# Patient Record
Sex: Female | Born: 1935 | Race: White | Hispanic: No | Smoking: Never smoker
Health system: Southern US, Community
[De-identification: ages and names within clinical notes are randomized; demographics above are authoritative.]

## PROBLEM LIST (undated history)

## (undated) DIAGNOSIS — E079 Disorder of thyroid, unspecified: Secondary | ICD-10-CM

## (undated) DIAGNOSIS — I10 Essential (primary) hypertension: Secondary | ICD-10-CM

## (undated) DIAGNOSIS — E785 Hyperlipidemia, unspecified: Secondary | ICD-10-CM

## (undated) HISTORY — DX: Hyperlipidemia, unspecified: E78.5

---

## 1987-04-16 HISTORY — PX: BALLOON ANGIOPLASTY, ARTERY: SHX564

## 1998-03-21 ENCOUNTER — Other Ambulatory Visit: Admission: RE | Admit: 1998-03-21 | Discharge: 1998-03-21 | Payer: Self-pay | Admitting: Obstetrics and Gynecology

## 1999-03-28 ENCOUNTER — Other Ambulatory Visit: Admission: RE | Admit: 1999-03-28 | Discharge: 1999-03-28 | Payer: Self-pay | Admitting: Obstetrics and Gynecology

## 2003-05-13 ENCOUNTER — Emergency Department (HOSPITAL_COMMUNITY): Admission: AD | Admit: 2003-05-13 | Discharge: 2003-05-14 | Payer: Self-pay | Admitting: Emergency Medicine

## 2008-04-15 HISTORY — PX: CATARACT EXTRACTION: SUR2

## 2012-06-20 ENCOUNTER — Encounter (HOSPITAL_COMMUNITY): Payer: Self-pay

## 2012-06-20 DIAGNOSIS — R0789 Other chest pain: Secondary | ICD-10-CM | POA: Insufficient documentation

## 2012-06-20 DIAGNOSIS — Z79899 Other long term (current) drug therapy: Secondary | ICD-10-CM | POA: Insufficient documentation

## 2012-06-20 DIAGNOSIS — G5610 Other lesions of median nerve, unspecified upper limb: Secondary | ICD-10-CM | POA: Insufficient documentation

## 2012-06-20 DIAGNOSIS — Z7982 Long term (current) use of aspirin: Secondary | ICD-10-CM | POA: Insufficient documentation

## 2012-06-20 DIAGNOSIS — E039 Hypothyroidism, unspecified: Secondary | ICD-10-CM | POA: Insufficient documentation

## 2012-06-20 DIAGNOSIS — I10 Essential (primary) hypertension: Secondary | ICD-10-CM | POA: Insufficient documentation

## 2012-06-20 DIAGNOSIS — Z9861 Coronary angioplasty status: Secondary | ICD-10-CM | POA: Insufficient documentation

## 2012-06-20 DIAGNOSIS — R9431 Abnormal electrocardiogram [ECG] [EKG]: Secondary | ICD-10-CM | POA: Insufficient documentation

## 2012-06-20 LAB — BASIC METABOLIC PANEL
BUN: 16 mg/dL (ref 6–23)
Chloride: 104 mEq/L (ref 96–112)
Creatinine, Ser: 0.84 mg/dL (ref 0.50–1.10)
GFR calc non Af Amer: 65 mL/min — ABNORMAL LOW (ref >90)
Glucose, Bld: 138 mg/dL — ABNORMAL HIGH (ref 70–99)
Potassium: 3.8 mEq/L (ref 3.5–5.1)

## 2012-06-20 LAB — CBC WITH DIFFERENTIAL/PLATELET
Basophils Relative: 1 % (ref 0–1)
Eosinophils Absolute: 0.2 10*3/uL (ref 0.0–0.7)
HCT: 40.7 % (ref 36.0–46.0)
Hemoglobin: 14.5 g/dL (ref 12.0–15.0)
MCH: 32.6 pg (ref 26.0–34.0)
MCHC: 35.6 g/dL (ref 30.0–36.0)
Monocytes Absolute: 0.7 10*3/uL (ref 0.1–1.0)
Monocytes Relative: 11 % (ref 3–12)

## 2012-06-20 NOTE — ED Notes (Signed)
Pt c/o numbness to hand and toes on left side for 3 days.  Pt states it has gotten a little worse over the days.  Pt able to move fingers and toes.  Pt ambulatory with steady gait in triage.  No weakness or slurred speech.

## 2012-06-20 NOTE — ED Notes (Addendum)
Per patient symptoms started approx 3 days ago, reports left sided numbness and tingling. Pt ambulatory, alert and oriented, and using left arm. Was sent to be evaluated by urgent care.

## 2012-06-21 ENCOUNTER — Other Ambulatory Visit: Payer: Self-pay

## 2012-06-21 ENCOUNTER — Emergency Department (HOSPITAL_COMMUNITY)
Admission: EM | Admit: 2012-06-21 | Discharge: 2012-06-21 | Disposition: A | Discharge status: Home / Self Care | Payer: Medicare Other | Attending: Emergency Medicine | Admitting: Emergency Medicine

## 2012-06-21 HISTORY — DX: Disorder of thyroid, unspecified: E07.9

## 2012-06-21 HISTORY — DX: Essential (primary) hypertension: I10

## 2012-06-21 NOTE — ED Notes (Signed)
D/c instructions reviewed w/ pt and family - pt and family deny any further questions or concerns at present. Pt ambulating independently w/ steady gait on d/c in no acute distress, A&Ox4.  

## 2012-06-21 NOTE — ED Provider Notes (Signed)
History     CSN: [id number]  Arrival date & time 06/20/12  1649   None     Chief Complaint  Patient presents with  . Numbness    (Consider location/radiation/quality/duration/timing/severity/associated sxs/prior treatment) HPI Brooke Larson is a 79 y.o. female with a history of hypertension and hypothyroidism also of coronary artery disease status post angioplasty in 1989, no stents, who presents with left hand tingling and left-sided toe tip tingling as well. In interviewing the patient, her son is present and says her mother has also been complaining about intermittent epigastric chest tightness. This is currently not present, it is mild, it comes and goes, there are no known alleviating or exacerbating factors, there are no other associated symptoms such as shortness of breath, nausea or vomiting, diaphoresis.  Patient is concern for stroke. She has no history of CVA or TIA. She has no changes in vision, no double vision or blurred vision, no other weakness, no other tingling. Patient is a right-handed female.  She says her left hand will intermittently tingles and she points the distribution of the palmar aspect of her thumb first and second fingers and part of her fourth digit., She does not include her fifth digit and does not occlude the dorsal aspect of her hand.  She has no history of carpal tunnel syndrome, she has no wrist pain that wakes her up at night. She has no recent fevers, no recent chills. No recent trauma.     Past Medical History  Diagnosis Date  . Hypertension   . Thyroid disease     Past Surgical History  Procedure Laterality Date  . Balloon angioplasty, artery      No family history on file.  History  Substance Use Topics  . Smoking status: Never Smoker   . Smokeless tobacco: Not on file  . Alcohol Use: No    OB History   Grav Para Term Preterm Abortions TAB SAB Ect Mult Living                  Review of Systems At least 10pt or greater review of  systems completed and are negative except where specified in the HPI.  Allergies  Review of patient's allergies indicates no known allergies.  Home Medications   Current Outpatient Rx  Name  Route  Sig  Dispense  Refill  . aspirin EC 81 MG tablet   Oral   Take 81 mg by mouth daily.         . carvedilol (COREG) 12.5 MG tablet   Oral   Take 12.5 mg by mouth 2 (two) times daily with a meal.         . levothyroxine (SYNTHROID, LEVOTHROID) 75 MCG tablet   Oral   Take 75 mcg by mouth daily.           BP 201/76  Pulse 66  Temp(Src) 98.3 F (36.8 C) (Oral)  Resp 18  SpO2 96%  Physical Exam  Nursing notes reviewed.  Electronic medical record reviewed. VITAL SIGNS:   Filed Vitals:   06/20/12 2019 06/21/12 0008  BP: 160/75 201/76  Pulse: 70 66  Temp: 98.2 F (36.8 C) 98.3 F (36.8 C)  TempSrc: Oral Oral  Resp: 16 18  SpO2: 96% 96%   CONSTITUTIONAL: Awake, oriented, appears non-toxic HENT: Atraumatic, normocephalic, oral mucosa pink and moist, airway patent. Nares patent without drainage. External ears normal. EYES: Conjunctiva clear, EOMI, PERRLA NECK: Trachea midline, non-tender, supple CARDIOVASCULAR: Normal heart rate,  Normal rhythm, No murmurs, rubs, gallops PULMONARY/CHEST: Clear to auscultation, no rhonchi, wheezes, or rales. Symmetrical breath sounds. Non-tender. ABDOMINAL: Non-distended, soft, non-tender - no rebound or guarding.  BS normal. NEUROLOGIC: Radial nerves are normal. Non-focal, moving all four extremities, no gross sensory or motor deficits. Strength in the upper and lower extremities flexors and extensors is 5/5. EXTREMITIES: No clubbing, cyanosis, or edema. Negative Tinel's and Phalen's. Patient has tingling in the distribution of the median nerve on the volar aspect of the left hand. Patient's hand strength and grip is 5/5. Tips of the toes have been numb, there is no weakness in the toes, left foot is neurovascularly intact. SKIN: Warm, Dry, No  erythema, No rash  ED Course  Procedures (including critical care time)  Date: 06/21/2012  Rate: 63  Rhythm: normal sinus rhythm  QRS Axis: normal  Intervals: normal  ST/T Wave abnormalities: normal  Conduction Disutrbances: none  Narrative Interpretation: unremarkable    Labs Reviewed  BASIC METABOLIC PANEL - Abnormal; Notable for the following:    Glucose, Bld 138 (*)    GFR calc non Af Amer 65 (*)    GFR calc Af Amer 75 (*)    All other components within normal limits  CBC WITH DIFFERENTIAL   No results found. No results found.    1. Median nerve neuropathy, left   2. Paresthesia of foot   3. Chest tightness       MDM  Brooke Larson is a 79 y.o. female presents to the ED from urgent care referred for left hand numbness and tingling as well as left toe tip numbness and tingling. The left toe tips I think are most likely related to wearing shoes, this has not had any neurologic pattern that I can think of, this started about 3 days ago, she has no weakness, no difficulties walking.  The left hand is in the median nerve distribution, but this is likely related to an early carpal tunnel syndrome, she has no Tinel's or Phalen's but these are notoriously nonspecific and insensitive test. We'll put her in a wrist splint.  Patient does have a primary care physician. She can followup with primary care physician for these diffuse problems. The son was suggesting the patient has been playing off some epigastric tightness versus chest tightness that is intermittent. Patient has a history of hypertension, she also has history of angioplasty in 1989. If this is been recent, what expect to see some abnormalities in her troponin levels.  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   Question anginal equivalent - but she's currently having no symptoms in the emergency department. Troponin is unremarkable. Because of these things I think she is a low  risk of myocardial infarction, she can followup with cardiologist.  Splint hand for early median neuropathy - f/u with PCP for hand numbness.  I explained the diagnosis and have given explicit precautions to return to the ER including any other new or worsening symptoms. The patient understands and accepts the medical plan as it's been dictated and I have answered their questions. Discharge instructions concerning home care and prescriptions have been given.  The patient is STABLE and is discharged to home in good condition.          Jones Skene, MD 06/25/12 [phone]

## 2014-09-22 ENCOUNTER — Encounter: Payer: Self-pay | Admitting: Neurology

## 2014-09-22 ENCOUNTER — Ambulatory Visit: Payer: Medicare Other | Admitting: Neurology

## 2014-09-22 ENCOUNTER — Ambulatory Visit (INDEPENDENT_AMBULATORY_CARE_PROVIDER_SITE_OTHER): Payer: Medicare Other | Admitting: Neurology

## 2014-09-22 VITALS — BP 154/89 | HR 65 | Temp 97.1°F | Ht 62.0 in | Wt 142.2 lb

## 2014-09-22 DIAGNOSIS — H6120 Impacted cerumen, unspecified ear: Secondary | ICD-10-CM

## 2014-09-22 DIAGNOSIS — H9193 Unspecified hearing loss, bilateral: Secondary | ICD-10-CM

## 2014-09-22 DIAGNOSIS — H6123 Impacted cerumen, bilateral: Secondary | ICD-10-CM

## 2014-09-22 DIAGNOSIS — I639 Cerebral infarction, unspecified: Secondary | ICD-10-CM

## 2014-09-22 DIAGNOSIS — R270 Ataxia, unspecified: Secondary | ICD-10-CM | POA: Diagnosis not present

## 2014-09-22 DIAGNOSIS — R42 Dizziness and giddiness: Secondary | ICD-10-CM

## 2014-09-22 DIAGNOSIS — G450 Vertebro-basilar artery syndrome: Secondary | ICD-10-CM | POA: Insufficient documentation

## 2014-09-22 DIAGNOSIS — I6350 Cerebral infarction due to unspecified occlusion or stenosis of unspecified cerebral artery: Secondary | ICD-10-CM

## 2014-09-22 DIAGNOSIS — H9190 Unspecified hearing loss, unspecified ear: Secondary | ICD-10-CM | POA: Insufficient documentation

## 2014-09-22 MED ORDER — ALPRAZOLAM 0.5 MG PO TABS: 0.5000 mg | ORAL_TABLET | Freq: Two times a day (BID) | ORAL | Status: DC | PRN

## 2014-09-22 NOTE — Progress Notes (Signed)
[facility]    Provider:  Dr Jaynee Eagles Referring Provider: Prince Solian, MD Primary Care Physician:  Gilford Rile, MD  CC:  dizziness  HPI:  Brooke Larson is a 81 y.o. female here as a referral from Dr. Dagmar Hait for dizziness. Past medical history of anxiety, coronary artery disease, hyperlipidemia, hypertension, hypothyroidism, malaise and fatigue, myalgia. She is here with her daughter who also provides information. Started in March after she woke up, she "felt like she didn't have legs" and fell down.She hit her head, not so hard. Her BP was over 474 systolic. She didn't go to the doctor, every morning she was dizzy walking. Since then it has progressed, more frequent throughout the day. On the 14th of March her BP went high and she was very dizzy. Workup was negative. No room spinning, not lightheaded, she is more off balance. Not so much in the morning. It happens when she stands up. The sun and light bothers her. She was placed on more BP medicine and BP is better but the dizziness is not. Happens every day and lasts for a brief time.  She can't drive, gets dizzy when she looks up through the windshield, no blurry vision, no hearing changes. When sitting, feels ok. She has been to the eye doctor. No preceding illness or inciting factors. She has had episodes of vertigo with room spinning as well. Meclizine didn't help. No other focal associated neurologic conditions. She has had a stroke. Here with daughter. She had a stroke 2 years ago.  No nausea or vomiting. She has some hearing loss especialy if there is a lot talking in the room. Denies headache.  Reviewed notes, labs and imaging from outside physicians, which showed: Per [facility] notes, patient describes vertigo as well as a mild level of orthostasis. CT of the head was unremarkable. Dizziness has occurred in the setting of hypertension as well as without elevated blood pressure. On one occasion for high  blood pressure occurred with dizziness and she was taken to the emergency department, she had a head CT and blood work and was discharged. CBC with differential unremarkable. TSH within normal limits. CMP unremarkable. LDL 154(cholesterol medication was recommended by PCP)  Review of Systems: Patient complains of symptoms per HPI as well as the following symptoms: Snoring, weakness, dizziness. Pertinent negatives per HPI. All others negative.   History   Social History  . Marital Status: Widowed    Spouse Name: N/A  . Number of Children: 3  . Years of Education: 12   Occupational History  . Retired     Social History Main Topics  . Smoking status: Never Smoker   . Smokeless tobacco: Not on file  . Alcohol Use: No  . Drug Use: No  . Sexual Activity: Not on file   Other Topics Concern  . Not on file   Social History Narrative   Lives at home with herself.   Caffeine use: 1/2 cup coffee per day, 2 glasses tea per day    Family History  Problem Relation Age of Onset  . Breast cancer Mother   . Stroke Father   . Alzheimer's disease Mother     Past Medical History  Diagnosis Date  . Hypertension   . Thyroid disease   . Hyperlipemia     Past Surgical History  Procedure Laterality Date  . Balloon angioplasty, artery  1989  . Cataract extraction Bilateral 2010    Current Outpatient Prescriptions  Medication Sig Dispense  Refill  . carvedilol (COREG) 12.5 MG tablet Take 12.5 mg by mouth 2 (two) times daily with a meal.    . clopidogrel (PLAVIX) 75 MG tablet Take 75 mg by mouth.    . levothyroxine (SYNTHROID, LEVOTHROID) 75 MCG tablet Take 75 mcg by mouth daily.    Marland Kitchen losartan (COZAAR) 50 MG tablet Take 50 mg by mouth daily.    Marland Kitchen ALPRAZolam (XANAX) 0.5 MG tablet Take 1 tablet (0.5 mg total) by mouth 2 (two) times daily as needed for anxiety. 60 tablet 3  . aspirin EC 81 MG tablet Take 81 mg by mouth daily.     No current facility-administered medications for this  visit.    Allergies as of 09/22/2014  . (No Known Allergies)    Vitals: BP 154/89 mmHg  Pulse 65  Temp(Src) 97.1 F (36.2 C)  Ht 5\' 2"  (1.575 m)  Wt 142 lb 3.2 oz (64.501 kg)  BMI 26.00 kg/m2 Last Weight:  Wt Readings from Last 1 Encounters:  09/22/14 142 lb 3.2 oz (64.501 kg)   Last Height:   Ht Readings from Last 1 Encounters:  09/22/14 5\' 2"  (1.575 m)   Physical exam: Exam: Gen: NAD, conversant, well nourised, well groomed                     CV: RRR, no MRG. No Carotid Bruits. No peripheral edema, warm, nontender Eyes: Conjunctivae clear without exudates or hemorrhage  Neuro: Detailed Neurologic Exam  Speech:    Speech is normal; fluent and spontaneous with normal comprehension.  Cognition:    The patient is oriented to person, place, and time;     recent and remote memory intact;     language fluent;     normal attention, concentration,     fund of knowledge Cranial Nerves:    The pupils are equal, round, and reactive to light. The fundi are flat. Visual fields are full to finger confrontation. Extraocular movements are intact. Trigeminal sensation is intact and the muscles of mastication are normal. The face is symmetric. The palate elevates in the midline. Hearing intact. Voice is normal. Shoulder shrug is normal. The tongue has normal motion without fasciculations.   Coordination:    No dysmetria  Gait:    No ataxia  Motor Observation:    No asymmetry, no atrophy, and no involuntary movements noted. Tone:    Normal muscle tone.    Posture:    Posture is normal. normal erect    Strength:    Strength is V/V in the upper and lower limbs.      Sensation: intact to LT     Reflex Exam:  DTR's:    Deep tendon reflexes in the upper and lower extremities are symmetrical bilaterally.   Toes:    The toes are downgoing bilaterally.   Clonus:    Clonus is absent.       Assessment/Plan:   Brooke Larson is a 81 y.o. female here as a referral from  Dr. Dagmar Hait for dizziness. Past medical history of anxiety, coronary artery disease, hyperlipidemia, hypertension, hypothyroidism, malaise and fatigue, myalgia. Dizziness started in March after she fell and hit her head in the setting of elevated blood pressure.  Her BP was over 034 systolic. Since then she is experiencing dizziness. Also has a history of vertigo and has vertiginous episodes with hearing loss. Suspect there is the component of anxiety as well.  Patient has risk factors for stroke and history of  stroke, need to rule out central cause of vertigo with MRI of the brain such as pathologies affecting the brainstem or vestibular nerve, vascular event involving the cerebellum, schwannoma, MRA to detect stenosis or occlusion of the posterior circulation.   - MRI of the brain and MRA of the head.   -Will refer to ENT for hearing loss, also possibly they can clear up the wax in her ears so the tympanic membranes can be evaluated as there may be fluid behind the ears causing dizziness.  - Needs physical therapy for vestibular rehabilitation.   Sarina Ill, MD  [facility] [address] [place],  [address]  Phone [phone] Fax [phone]

## 2014-09-22 NOTE — Patient Instructions (Signed)
Overall you are doing fairly well but I do want to suggest a few things today:   Remember to drink plenty of fluid, eat healthy meals and do not skip any meals. Try to eat protein with a every meal and eat a healthy snack such as fruit or nuts in between meals. Try to keep a regular sleep-wake schedule and try to exercise daily, particularly in the form of walking, 20-30 minutes a day, if you can.   As far as your medications are concerned, I would like to suggest: Alprazolam 0.5mg  twice daily  As far as diagnostic testing: MRi of the brain, ENT, vestibular rehab  I would like to see you back in 3 months, sooner if we need to. Please call us with any interim questions, concerns, problems, updates or refill requests.   Please also call us for any test results so we can go over those with you on the phone.  My clinical assistant and will answer any of your questions and relay your messages to me and also relay most of my messages to you.   Our phone number is [phone]. We also have an after hours call service for urgent matters and there is a physician on-call for urgent questions. For any emergencies you know to call 911 or go to the nearest emergency room

## 2014-09-24 ENCOUNTER — Encounter: Payer: Self-pay | Admitting: Neurology

## 2014-10-10 ENCOUNTER — Ambulatory Visit
Admission: RE | Admit: 2014-10-10 | Discharge: 2014-10-10 | Disposition: A | Discharge status: Home / Self Care | Payer: Medicare Other | Source: Ambulatory Visit | Attending: Neurology | Admitting: Neurology

## 2014-10-10 DIAGNOSIS — R270 Ataxia, unspecified: Secondary | ICD-10-CM | POA: Diagnosis not present

## 2014-10-10 DIAGNOSIS — I639 Cerebral infarction, unspecified: Secondary | ICD-10-CM

## 2014-10-10 DIAGNOSIS — G450 Vertebro-basilar artery syndrome: Secondary | ICD-10-CM

## 2014-10-10 DIAGNOSIS — H9193 Unspecified hearing loss, bilateral: Secondary | ICD-10-CM

## 2014-10-10 DIAGNOSIS — R42 Dizziness and giddiness: Secondary | ICD-10-CM

## 2014-10-10 DIAGNOSIS — H6120 Impacted cerumen, unspecified ear: Secondary | ICD-10-CM

## 2014-10-20 ENCOUNTER — Telehealth: Payer: Self-pay | Admitting: *Deleted

## 2014-10-20 NOTE — Telephone Encounter (Signed)
Left message for pt to call back regarding results Dr. Jaynee Eagles previously left a message about. Gave [facility] phone number and office hours.

## 2014-10-31 NOTE — Telephone Encounter (Signed)
-----   Message from Melvenia Beam, MD sent at 10/14/2014 11:43 AM EDT ----- Called and left a message for patient. She has Extensive T2/FLAIR hyperintense foci in the white matter in the pons and bilateral hemispheres most consistent with chronic microvascular ischemic changes, more than expected for age. This finding appears to be stable and there is no definite change when compared to an MRI dated 11/19/2012. The good news is that there are no new changes but she has a lot of white matter small-vessel disease from poorly controlled vascular risk factors especially HTN. The best thing to do is follow with her primary care to tightly control her blood pressure, cholesterol, glucose and other vascular risk factors. Terrence Dupont, would you call and ensure patient received my message, go over results again, and see if she has any questions. If they like, I am happy to discuss personally with them or have them come to a follow up with me. thanks

## 2014-10-31 NOTE — Telephone Encounter (Signed)
Spoke to patient - reviewed results again - she will make an appt w/ her PCP.

## 2017-05-13 ENCOUNTER — Emergency Department (HOSPITAL_COMMUNITY)
Admission: EM | Admit: 2017-05-13 | Discharge: 2017-05-13 | Disposition: A | Discharge status: Home / Self Care | Payer: Medicare Other | Attending: Emergency Medicine | Admitting: Emergency Medicine

## 2017-05-13 ENCOUNTER — Encounter (HOSPITAL_COMMUNITY): Payer: Self-pay | Admitting: Emergency Medicine

## 2017-05-13 ENCOUNTER — Emergency Department (HOSPITAL_COMMUNITY): Payer: Medicare Other

## 2017-05-13 DIAGNOSIS — T50905A Adverse effect of unspecified drugs, medicaments and biological substances, initial encounter: Secondary | ICD-10-CM | POA: Insufficient documentation

## 2017-05-13 DIAGNOSIS — Z8673 Personal history of transient ischemic attack (TIA), and cerebral infarction without residual deficits: Secondary | ICD-10-CM | POA: Diagnosis not present

## 2017-05-13 DIAGNOSIS — T887XXA Unspecified adverse effect of drug or medicament, initial encounter: Secondary | ICD-10-CM | POA: Insufficient documentation

## 2017-05-13 DIAGNOSIS — Y69 Unspecified misadventure during surgical and medical care: Secondary | ICD-10-CM | POA: Diagnosis not present

## 2017-05-13 DIAGNOSIS — I10 Essential (primary) hypertension: Secondary | ICD-10-CM | POA: Diagnosis not present

## 2017-05-13 DIAGNOSIS — E860 Dehydration: Secondary | ICD-10-CM | POA: Diagnosis not present

## 2017-05-13 DIAGNOSIS — Z7982 Long term (current) use of aspirin: Secondary | ICD-10-CM | POA: Diagnosis not present

## 2017-05-13 DIAGNOSIS — Z9181 History of falling: Secondary | ICD-10-CM | POA: Insufficient documentation

## 2017-05-13 DIAGNOSIS — Z7901 Long term (current) use of anticoagulants: Secondary | ICD-10-CM | POA: Diagnosis not present

## 2017-05-13 DIAGNOSIS — Z79899 Other long term (current) drug therapy: Secondary | ICD-10-CM | POA: Diagnosis not present

## 2017-05-13 DIAGNOSIS — R5383 Other fatigue: Secondary | ICD-10-CM | POA: Insufficient documentation

## 2017-05-13 DIAGNOSIS — R531 Weakness: Secondary | ICD-10-CM | POA: Insufficient documentation

## 2017-05-13 LAB — I-STAT VENOUS BLOOD GAS, ED
ACID-BASE EXCESS: 4 mmol/L — AB (ref 0.0–2.0)
Bicarbonate: 29.6 mmol/L — ABNORMAL HIGH (ref 20.0–28.0)
O2 SAT: 47 %
PCO2 VEN: 49.5 mmHg (ref 44.0–60.0)
TCO2: 31 mmol/L (ref 22–32)
pH, Ven: 7.385 (ref 7.250–7.430)
pO2, Ven: 26 mmHg — CL (ref 32.0–45.0)

## 2017-05-13 LAB — CBC
HCT: 39.5 % (ref 36.0–46.0)
Hemoglobin: 13.3 g/dL (ref 12.0–15.0)
MCH: 32.1 pg (ref 26.0–34.0)
MCHC: 33.7 g/dL (ref 30.0–36.0)
MCV: 95.4 fL (ref 78.0–100.0)
PLATELETS: 246 10*3/uL (ref 150–400)
RBC: 4.14 MIL/uL (ref 3.87–5.11)
RDW: 13.3 % (ref 11.5–15.5)
WBC: 4.8 10*3/uL (ref 4.0–10.5)

## 2017-05-13 LAB — COMPREHENSIVE METABOLIC PANEL
ALT: 16 U/L (ref 14–54)
AST: 28 U/L (ref 15–41)
Albumin: 3.7 g/dL (ref 3.5–5.0)
Alkaline Phosphatase: 73 U/L (ref 38–126)
Anion gap: 10 (ref 5–15)
BILIRUBIN TOTAL: 1.4 mg/dL — AB (ref 0.3–1.2)
BUN: 9 mg/dL (ref 6–20)
CHLORIDE: 104 mmol/L (ref 101–111)
CO2: 25 mmol/L (ref 22–32)
CREATININE: 1.15 mg/dL — AB (ref 0.44–1.00)
Calcium: 9.1 mg/dL (ref 8.9–10.3)
GFR calc non Af Amer: 43 mL/min — ABNORMAL LOW (ref >60)
GFR, EST AFRICAN AMERICAN: 50 mL/min — AB (ref >60)
Glucose, Bld: 139 mg/dL — ABNORMAL HIGH (ref 65–99)
POTASSIUM: 3.6 mmol/L (ref 3.5–5.1)
Sodium: 139 mmol/L (ref 135–145)
TOTAL PROTEIN: 6.2 g/dL — AB (ref 6.5–8.1)

## 2017-05-13 LAB — URINALYSIS, ROUTINE W REFLEX MICROSCOPIC
Bilirubin Urine: NEGATIVE
Glucose, UA: NEGATIVE mg/dL
HGB URINE DIPSTICK: NEGATIVE
Ketones, ur: NEGATIVE mg/dL
NITRITE: NEGATIVE
PROTEIN: NEGATIVE mg/dL
SPECIFIC GRAVITY, URINE: 1.005 (ref 1.005–1.030)
pH: 6 (ref 5.0–8.0)

## 2017-05-13 LAB — I-STAT TROPONIN, ED: TROPONIN I, POC: 0 ng/mL (ref 0.00–0.08)

## 2017-05-13 LAB — CBG MONITORING, ED: Glucose-Capillary: 135 mg/dL — ABNORMAL HIGH (ref 65–99)

## 2017-05-13 LAB — TSH: TSH: 1.737 u[IU]/mL (ref 0.350–4.500)

## 2017-05-13 MED ORDER — IPRATROPIUM-ALBUTEROL 0.5-2.5 (3) MG/3ML IN SOLN
3.0000 mL | Freq: Once | RESPIRATORY_TRACT | Status: AC
  Administered 2017-05-13: 3 mL via RESPIRATORY_TRACT
  Filled 2017-05-13: qty 3

## 2017-05-13 MED ORDER — SODIUM CHLORIDE 0.9 % IV BOLUS (SEPSIS)
1000.0000 mL | Freq: Once | INTRAVENOUS | Status: AC
  Administered 2017-05-13: 1000 mL via INTRAVENOUS

## 2017-05-13 NOTE — ED Notes (Signed)
Patient Alert and oriented X4. Stable and ambulatory. Patient verbalized understanding of the discharge instructions.  Patient belongings were taken by the patient.  

## 2017-05-13 NOTE — ED Notes (Signed)
Pt alert and oriented x 4 with no focal neuro deficits. Ambulatory in triage.

## 2017-05-13 NOTE — ED Provider Notes (Signed)
[facility] EMERGENCY DEPARTMENT Provider Note   CSN: [id number] Arrival date & time: 05/13/17  1501     History   Chief Complaint Chief Complaint  Patient presents with  . Fall  . Altered Mental Status    HPI Brooke Larson is a 84 y.o. female.  HPI   84 year old female with history of hypertension, hyperlipidemia, here with generalized weakness.  The patient reportedly has had multiple infections over the last month.  She was treated for a pneumonia at the end of December into January and finished 10 days of antibiotics and prednisone.  She felt slightly improved the next week.  Approximately a week and a half ago, patient reportedly fell and hit her head.  She is on Plavix.  She had no loss of consciousness.  Since then, she has had moderate tailbone pain but denies any headache.  No focal numbness or weakness.  She states that she has just had very poor energy.  She is also currently on antibiotic for UTI, ciprofloxacin.  She denies any urinary symptoms.  Denies any change in her appetite.  States she just feels "very tired" all the time.  Denies any recent medication changes.  She is not on any new medicines.  Denies any pain.  No lower extremity or other focal numbness or weakness.  Past Medical History:  Diagnosis Date  . Hyperlipemia   . Hypertension   . Thyroid disease     Patient Active Problem List   Diagnosis Date Noted  . Dizziness and giddiness 09/22/2014  . 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 Ischemic stroke ([place]) 09/22/2014  . 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 Wax in ear 09/22/2014  . Hearing loss 09/22/2014    Past Surgical History:  Procedure Laterality Date  . BALLOON ANGIOPLASTY, ARTERY  1989  . CATARACT EXTRACTION Bilateral 2010    OB History    No data available       Home Medications    Prior to Admission medications   Medication Sig Start Date End Date Taking? Authorizing Provider  ALPRAZolam Duanne Moron) 0.5 MG tablet Take 1  tablet (0.5 mg total) by mouth 2 (two) times daily as needed for anxiety. 09/22/14   Melvenia Beam, MD  aspirin EC 81 MG tablet Take 81 mg by mouth daily.    [provider]  carvedilol (COREG) 12.5 MG tablet Take 12.5 mg by mouth 2 (two) times daily with a meal.    [provider]  clopidogrel (PLAVIX) 75 MG tablet Take 75 mg by mouth. 12/29/13   [provider]  levothyroxine (SYNTHROID, LEVOTHROID) 75 MCG tablet Take 75 mcg by mouth daily.    [provider]  losartan (COZAAR) 50 MG tablet Take 50 mg by mouth daily. 08/17/14   [provider]    Family History Family History  Problem Relation Age of Onset  . Breast cancer Mother   . Alzheimer's disease Mother   . Stroke Father     Social History Social History   Tobacco Use  . Smoking status: Never Smoker  Substance Use Topics  . Alcohol use: No  . Drug use: No     Allergies   Patient has no known allergies.   Review of Systems Review of Systems  Constitutional: Positive for fatigue.  Neurological: Positive for weakness.  Psychiatric/Behavioral: Positive for confusion.  All other systems reviewed and are negative.    Physical Exam Updated Vital Signs BP (!) 177/71   Pulse 68  Temp 98.4 F (36.9 C) (Oral)   Resp 19   Ht 5\' 2"  (1.575 m)   Wt 58.5 kg (129 lb)   SpO2 96%   BMI 23.59 kg/m   Physical Exam   ED Treatments / Results  Labs (all labs ordered are listed, but only abnormal results are displayed) Labs Reviewed  COMPREHENSIVE METABOLIC PANEL - Abnormal; Notable for the following components:      Result Value   Glucose, Bld 139 (*)    Creatinine, Ser 1.15 (*)    Total Protein 6.2 (*)    Total Bilirubin 1.4 (*)    GFR calc non Af Amer 43 (*)    GFR calc Af Amer 50 (*)    All other components within normal limits  URINALYSIS, ROUTINE W REFLEX MICROSCOPIC - Abnormal; Notable for the following components:   Color, Urine STRAW (*)    Leukocytes, UA  MODERATE (*)    Bacteria, UA RARE (*)    Squamous Epithelial / LPF 0-5 (*)    All other components within normal limits  CBG MONITORING, ED - Abnormal; Notable for the following components:   Glucose-Capillary 135 (*)    All other components within normal limits  I-STAT VENOUS BLOOD GAS, ED - Abnormal; Notable for the following components:   pO2, Ven 26.0 (*)    Bicarbonate 29.6 (*)    Acid-Base Excess 4.0 (*)    All other components within normal limits  URINE CULTURE  CBC  TSH  I-STAT TROPONIN, ED    EKG  EKG Interpretation  Date/Time:  Tuesday May 13 2017 17:40:17 EST Ventricular Rate:  70 PR Interval:    QRS Duration: 95 QT Interval:  394 QTC Calculation: 426 R Axis:   -13 Text Interpretation:  Sinus rhythm Consider anterior infarct Baseline wander in lead(s) V2 V6 No significant change since last tracing Confirmed by Duffy Bruce [phone]) on 05/13/2017 6:10:30 PM       Radiology Dg Chest 2 View  Result Date: 05/13/2017 CLINICAL DATA:  Pt had a fall 10 days ago and family reports increased confusion since hitting her head. Pt denies pain in hips. Pt denies chest complaints. Recently treated for PNA. EXAM: CHEST  2 VIEW COMPARISON:  Chest x-ray dated 06/27/2014. chest CT dated 04/25/2017. FINDINGS: Heart size and mediastinal contours are stable. Lungs are stable. No pleural effusion or pneumothorax seen. No acute or suspicious osseous finding. Stable kyphosis of the thoracic spine. IMPRESSION: No active cardiopulmonary disease. Electronically Signed   By: Franki Cabot M.D.   On: 05/13/2017 18:53   Ct Head Wo Contrast  Result Date: 05/13/2017 CLINICAL DATA:  84 year old who fell 10 days ago, striking the back of her head. Patient is currently anticoagulated with Plavix. EXAM: CT HEAD WITHOUT CONTRAST TECHNIQUE: Contiguous axial images were obtained from the base of the skull through the vertex without intravenous contrast. COMPARISON:  MRI brain 10/10/2014, 11/19/2012.   CT head 06/27/2014. FINDINGS: Brain: Head tilt in the gantry accounts for the apparent asymmetry in the cerebral hemispheres. Moderate age related cortical atrophy and mild deep atrophy. Asymmetric enlargement of the occipital horn of the left lateral ventricle, unchanged. Severe changes of small vessel disease of the white matter diffusely, unchanged. No mass lesion. No midline shift. No acute hemorrhage or hematoma. No extra-axial fluid collections. No evidence of acute infarction. Vascular: Severe bilateral carotid siphon and vertebral artery atherosclerosis. No hyperdense vessel. Skull: No skull fracture or other focal osseous abnormality involving the skull. Sinuses/Orbits: Visualized paranasal  sinuses, bilateral mastoid air cells and bilateral middle ear cavities well-aerated. Visualized orbits and globes normal in appearance. Other: None. IMPRESSION: 1. No acute intracranial abnormality. 2. Stable moderate age related cortical atrophy and stable severe chronic microvascular ischemic changes of the white matter. Electronically Signed   By: Evangeline Dakin M.D.   On: 05/13/2017 16:58   Dg Hips Bilat W Or Wo Pelvis 3-4 Views  Result Date: 05/13/2017 CLINICAL DATA:  Fall 10 days ago, increased confusion. EXAM: DG HIP (WITH OR WITHOUT PELVIS) 3-4V BILAT COMPARISON:  None. FINDINGS: Single view of the pelvis and two views of each hip are provided. Osseous alignment is normal. No fracture line or displaced fracture fragment seen. No significant degenerative change at either hip joint. Soft tissues about the pelvis and hips are unremarkable. IMPRESSION: Negative. Electronically Signed   By: Franki Cabot M.D.   On: 05/13/2017 18:54    Procedures Procedures (including critical care time)  Medications Ordered in ED Medications  sodium chloride 0.9 % bolus 1,000 mL (0 mLs Intravenous Stopped 05/13/17 1837)  ipratropium-albuterol (DUONEB) 0.5-2.5 (3) MG/3ML nebulizer solution 3 mL (3 mLs Nebulization Given  05/13/17 1738)     Initial Impression / Assessment and Plan / ED Course  I have reviewed the triage vital signs and the nursing notes.  Pertinent labs & imaging results that were available during my care of the patient were reviewed by me and considered in my medical decision making (see chart for details).     84 year old female with past medical history as above here with generalized weakness in the setting of recent pneumonia as well as possible UTI.  I suspect the patient's symptoms are likely secondary to possible deconditioning in the setting of recent pneumonia, as well as potentially adverse effects as her slight confusion did seem to increase when she began her ciprofloxacin, which is a known side effect of fluoroquinolone.  She is been on multiple steroids as well as antibiotics over the last month, which is likely contributing to some of her reported symptoms of transient delirium due to polypharmacy.  Given undifferentiated nature of her symptoms, broad labs and imaging sent as above.  CT head is negative.  Blood gas is reassuring.  Chest x-ray is without pneumonia.  TSH normal.  White blood cell count is normal and renal function and CMP is at baseline other than exception of potentially mild dehydration, for which the patient has been given fluids.  Urinalysis does show pyuria, but the patient strongly denies any dysuria or symptoms of UTI and I suspect that she likely has chronic colonization given that this is after multiple doses and rounds of antibiotics.  I do not suspect the patient has ongoing UTI.  Given very reassuring lab work and correlation of her slight increased fatigue in the setting of starting a fluoroquinolone, I suspect the patient likely has deconditioning as well as a component of polypharmacy.  No other apparent acute emergent pathology is identified.  She has also stopped taking her regular medications, which could be contributing.  I advised her to take a probiotic  given her heavy recent antibiotic use, resume her home medications, and hold on further antibiotics at this time.  We discussed following up with her PCP for further testing and imaging as indicated and gave good return precautions to the patient as well as family in detail, who are in agreement.  Final Clinical Impressions(s) / ED Diagnoses   Final diagnoses:  Adverse effect of drug, initial encounter  Dehydration  Fatigue, unspecified type    ED Discharge Orders    None       Duffy Bruce, MD 05/14/17 0006

## 2017-05-13 NOTE — ED Notes (Signed)
Patient returned from XRAY 

## 2017-05-13 NOTE — ED Notes (Signed)
Patient back from CT being hooked up to monitor at this time

## 2017-05-13 NOTE — ED Triage Notes (Addendum)
Pt and family report a fall 10 days ago where she hit the back of her head. Pt currently taking plavix. Family notes increased confusion and not "acting/feeling like herself" pt denies pain. Family also reports recently being treated for pneumonia and UTI and has finished her antibiotics.

## 2017-05-13 NOTE — Discharge Instructions (Signed)
As we discussed, I think it's important that you start taking your NORMAL medications tonight. You have now completed your antibiotic courses and given that you have no urinary symptoms, I do not think you have a UTI today.   I recommend eating yogurt once daily or taking a probiotic to help with your appetite and GI side effects from antibiotics.  Drink 6-8 glasses of water daily  Follow-up with your Primary Doctor in 3-5 days for repeat exam and check-up. If you continue to have general fatigue, you may need further imaging and work-up.

## 2017-05-13 NOTE — ED Notes (Signed)
Pt encouraged to use restroom. Does not need to go at this time. 

## 2017-05-15 LAB — URINE CULTURE: Culture: NO GROWTH

## 2019-06-05 ENCOUNTER — Emergency Department (HOSPITAL_COMMUNITY): Payer: Medicare Other

## 2019-06-05 ENCOUNTER — Other Ambulatory Visit: Payer: Self-pay

## 2019-06-05 ENCOUNTER — Emergency Department (HOSPITAL_COMMUNITY)
Admission: EM | Admit: 2019-06-05 | Discharge: 2019-06-05 | Disposition: A | Discharge status: Home / Self Care | Payer: Medicare Other | Attending: Emergency Medicine | Admitting: Emergency Medicine

## 2019-06-05 ENCOUNTER — Encounter (HOSPITAL_COMMUNITY): Payer: Self-pay | Admitting: *Deleted

## 2019-06-05 DIAGNOSIS — S3282XA Multiple fractures of pelvis without disruption of pelvic ring, initial encounter for closed fracture: Secondary | ICD-10-CM | POA: Insufficient documentation

## 2019-06-05 DIAGNOSIS — W01198A Fall on same level from slipping, tripping and stumbling with subsequent striking against other object, initial encounter: Secondary | ICD-10-CM | POA: Insufficient documentation

## 2019-06-05 DIAGNOSIS — Y9389 Activity, other specified: Secondary | ICD-10-CM | POA: Insufficient documentation

## 2019-06-05 DIAGNOSIS — Y92008 Other place in unspecified non-institutional (private) residence as the place of occurrence of the external cause: Secondary | ICD-10-CM | POA: Diagnosis not present

## 2019-06-05 DIAGNOSIS — S0083XA Contusion of other part of head, initial encounter: Secondary | ICD-10-CM | POA: Insufficient documentation

## 2019-06-05 DIAGNOSIS — Y999 Unspecified external cause status: Secondary | ICD-10-CM | POA: Insufficient documentation

## 2019-06-05 DIAGNOSIS — I10 Essential (primary) hypertension: Secondary | ICD-10-CM | POA: Diagnosis not present

## 2019-06-05 DIAGNOSIS — Z23 Encounter for immunization: Secondary | ICD-10-CM | POA: Diagnosis not present

## 2019-06-05 DIAGNOSIS — S0990XA Unspecified injury of head, initial encounter: Secondary | ICD-10-CM | POA: Diagnosis present

## 2019-06-05 LAB — COMPREHENSIVE METABOLIC PANEL
ALT: 21 U/L (ref 0–44)
AST: 27 U/L (ref 15–41)
Albumin: 3.9 g/dL (ref 3.5–5.0)
Alkaline Phosphatase: 56 U/L (ref 38–126)
Anion gap: 12 (ref 5–15)
BUN: 14 mg/dL (ref 8–23)
CO2: 25 mmol/L (ref 22–32)
Calcium: 8.9 mg/dL (ref 8.9–10.3)
Chloride: 102 mmol/L (ref 98–111)
Creatinine, Ser: 1.02 mg/dL — ABNORMAL HIGH (ref 0.44–1.00)
GFR calc Af Amer: 58 mL/min — ABNORMAL LOW (ref >60)
GFR calc non Af Amer: 50 mL/min — ABNORMAL LOW (ref >60)
Glucose, Bld: 154 mg/dL — ABNORMAL HIGH (ref 70–99)
Potassium: 3.8 mmol/L (ref 3.5–5.1)
Sodium: 139 mmol/L (ref 135–145)
Total Bilirubin: 1.9 mg/dL — ABNORMAL HIGH (ref 0.3–1.2)
Total Protein: 6.1 g/dL — ABNORMAL LOW (ref 6.5–8.1)

## 2019-06-05 LAB — CBC WITH DIFFERENTIAL/PLATELET
Abs Immature Granulocytes: 0.22 10*3/uL — ABNORMAL HIGH (ref 0.00–0.07)
Basophils Absolute: 0 10*3/uL (ref 0.0–0.1)
Basophils Relative: 0 %
Eosinophils Absolute: 0 10*3/uL (ref 0.0–0.5)
Eosinophils Relative: 0 %
HCT: 38.6 % (ref 36.0–46.0)
Hemoglobin: 13.2 g/dL (ref 12.0–15.0)
Immature Granulocytes: 2 %
Lymphocytes Relative: 8 %
Lymphs Abs: 1.1 10*3/uL (ref 0.7–4.0)
MCH: 32.8 pg (ref 26.0–34.0)
MCHC: 34.2 g/dL (ref 30.0–36.0)
MCV: 96 fL (ref 80.0–100.0)
Monocytes Absolute: 0.9 10*3/uL (ref 0.1–1.0)
Monocytes Relative: 6 %
Neutro Abs: 12.5 10*3/uL — ABNORMAL HIGH (ref 1.7–7.7)
Neutrophils Relative %: 84 %
Platelets: 187 10*3/uL (ref 150–400)
RBC: 4.02 MIL/uL (ref 3.87–5.11)
RDW: 11.9 % (ref 11.5–15.5)
WBC: 14.8 10*3/uL — ABNORMAL HIGH (ref 4.0–10.5)
nRBC: 0 % (ref 0.0–0.2)

## 2019-06-05 LAB — CBG MONITORING, ED: Glucose-Capillary: 154 mg/dL — ABNORMAL HIGH (ref 70–99)

## 2019-06-05 MED ORDER — TETANUS-DIPHTH-ACELL PERTUSSIS 5-2.5-18.5 LF-MCG/0.5 IM SUSP
0.5000 mL | Freq: Once | INTRAMUSCULAR | Status: AC
  Administered 2019-06-05: 03:00:00 0.5 mL via INTRAMUSCULAR
  Filled 2019-06-05: qty 0.5

## 2019-06-05 MED ORDER — ACETAMINOPHEN 325 MG PO TABS: 650.0000 mg | ORAL_TABLET | Freq: Four times a day (QID) | ORAL | 0 refills | Status: DC

## 2019-06-05 MED ORDER — OXYCODONE-ACETAMINOPHEN 5-325 MG PO TABS
1.0000 | ORAL_TABLET | Freq: Once | ORAL | Status: AC
  Administered 2019-06-05: 04:00:00 1 via ORAL
  Filled 2019-06-05: qty 1

## 2019-06-05 MED ORDER — OXYCODONE HCL 5 MG PO TABS: 5.0000 mg | ORAL_TABLET | ORAL | 0 refills | Status: DC | PRN

## 2019-06-05 NOTE — ED Notes (Signed)
Pt ambulated in hall, gait unsteady, right side dependence noted. Pt advised gait is not baseline but she feels she would be safe to ambulate independently with a walker.

## 2019-06-05 NOTE — ED Notes (Signed)
Son called and spoke with [facility]. Son reported Pt  Did not need a walker because Family had multiple walkers at home. Staff  Reported to Son that SW was consulted for [facility].  CN was consulted for advice on DC Pt before SW saw PT. CN agreed with the plan to send Pt home.with son  Before SW saw Pt in hospital.

## 2019-06-05 NOTE — ED Triage Notes (Signed)
Pt arrives from home via [facility]. Per report by EMS, Pt fell around 2200, slipped, fell onto the left side in her garage. Left hip and left arm pain. No deformity or crepitus noted. Skin tear to the left hand. Pt denies hitting her head. No LOC. Hx of stroke, is on PLAVIX. 144/76, hr 70, SPO2 93%, RR 18, GCS 15.

## 2019-06-05 NOTE — ED Notes (Signed)
Family updated on disposition status

## 2019-06-05 NOTE — ED Notes (Addendum)
Pt to Xray.

## 2019-06-05 NOTE — ED Notes (Signed)
Patient verbalizes understanding of discharge instructions . Opportunity for questions and answers were provided . Armband removed by staff ,Pt discharged from ED. W/C  offered at D/C  and Declined W/C at D/C and was escorted to lobby by RN.  

## 2019-06-05 NOTE — ED Provider Notes (Signed)
Emergency Department Provider Note   I have reviewed the triage vital signs and the nursing notes.   HISTORY  Chief Complaint Fall and Level II   HPI Brooke Larson is a 86 y.o. female medical pause document below who presents to the emergency department today after mechanical fall.  Initially states she did not hit her head but when I noticed a contusion there she states that she must of.  She also states that she did not lose consciousness but does not remember the whole fall.  She states that she stepped on something in her carport.  She also complains of left hand pain.  Unknown last tetanus shot.  No pelvis pain, back pain, neck pain, abdominal pain, right lower extremity pain.   No other associated or modifying symptoms.    Past Medical History:  Diagnosis Date  . Hyperlipemia   . Hypertension   . Thyroid disease     Patient Active Problem List   Diagnosis Date Noted  . Dizziness and giddiness 09/22/2014  . 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 Ischemic stroke ([place]) 09/22/2014  . 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 Wax in ear 09/22/2014  . Hearing loss 09/22/2014    Past Surgical History:  Procedure Laterality Date  . BALLOON ANGIOPLASTY, ARTERY  1989  . CATARACT EXTRACTION Bilateral 2010    Current Outpatient Rx  . Order #: [id number] Class: Print  . Order #: [id number] Class: Print  . Order #: [id number] Class: Historical Med  . Order #: [id number] Class: Historical Med  . Order #: [id number] Class: Historical Med  . Order #: [id number] Class: Historical Med  . Order #: [id number] Class: Historical Med  . Order #: [id number] Class: Print    Allergies Patient has no known allergies.  Family History  Problem Relation Age of Onset  . Breast cancer Mother   . Alzheimer's disease Mother   . Stroke Father     Social History Social History   Tobacco Use  . Smoking status: Never Smoker  Substance Use Topics  . Alcohol use: No  . Drug use: No    Review of Systems  All  other systems negative except as documented in the HPI. All pertinent positives and negatives as reviewed in the HPI. ____________________________________________   PHYSICAL EXAM:  VITAL SIGNS: ED Triage Vitals  Enc Vitals Group     BP 06/05/19 0132 136/66     Pulse Rate 06/05/19 0132 70     Resp 06/05/19 0132 14     Temp 06/05/19 0132 97.8 F (36.6 C)     Temp Source 06/05/19 0132 Oral     SpO2 06/05/19 0132 94 %     Weight 06/05/19 0153 132 lb 4.4 oz (60 kg)     Height 06/05/19 0153 5\' 1"  (1.549 m)    Constitutional: Alert and oriented. Well appearing and in no acute distress. Eyes: Conjunctivae are normal. PERRL. EOMI. Head: Contusion just behind the hairline left forehead. Nose: No congestion/rhinnorhea. Mouth/Throat: Mucous membranes are moist.  Oropharynx non-erythematous. Neck: No stridor.  No meningeal signs.   Cardiovascular: Normal rate, regular rhythm. Good peripheral circulation. Grossly normal heart sounds.   Respiratory: Normal respiratory effort.  No retractions. Lungs CTAB. Gastrointestinal: Soft and nontender. No distention.  Musculoskeletal: No lower extremity tenderness nor edema. No gross deformities of extremities. Neurologic:  Normal speech and language. No gross focal neurologic deficits are appreciated.  Skin:  Skin is warm, dry and intact. No rash noted.  Dried blood on left hand and forehead.  ____________________________________________  LABS (all labs ordered are listed, but only abnormal results are displayed)  Labs Reviewed  CBC WITH DIFFERENTIAL/PLATELET - Abnormal; Notable for the following components:      Result Value   WBC 14.8 (*)    Neutro Abs 12.5 (*)    Abs Immature Granulocytes 0.22 (*)    All other components within normal limits  COMPREHENSIVE METABOLIC PANEL - Abnormal; Notable for the following components:   Glucose, Bld 154 (*)    Creatinine, Ser 1.02 (*)    Total Protein 6.1 (*)    Total Bilirubin 1.9 (*)    GFR calc non  Af Amer 50 (*)    GFR calc Af Amer 58 (*)    All other components within normal limits  CBG MONITORING, ED - Abnormal; Notable for the following components:   Glucose-Capillary 154 (*)    All other components within normal limits   ____________________________________________  EKG   EKG Interpretation  Date/Time:  Saturday June 05 2019 01:51:32 EST Ventricular Rate:  66 PR Interval:    QRS Duration: 95 QT Interval:  424 QTC Calculation: 445 R Axis:   -8 Text Interpretation: Sinus rhythm Low voltage, precordial leads No significant change since last tracing Confirmed by Merrily Pew [phone]) on 06/05/2019 1:57:37 AM       ____________________________________________  RADIOLOGY  No results found.  ____________________________________________    INITIAL IMPRESSION / ASSESSMENT AND PLAN / ED COURSE  No evidence of traumatic injury.  She is very sore all over but is able to bear weight without specific bony pain.  Suspect this is just muscular soreness from the fall.  Will attempt to make the pain little bit better and ambulate if not we will get a walker.  States still difficult walking. Will obtain pelvis xr just to ensure no occult fractures. If normal, will dc to wait CM/SW eval for walker and home PT/OT.  Does have non-operable pelvic fractures. Pain controlled here and able to walk but not well enough for ADL's at this time. Consult to CM for wheelchair and home PT. Fu w/ orthopedics in about a week if pain not improving or other difficulties.     Pertinent labs & imaging results that were available during my care of the patient were reviewed by me and considered in my medical decision making (see chart for details).  A medical screening exam was performed and I feel the patient has had an appropriate workup for their chief complaint at this time and likelihood of emergent condition existing is low. They have been counseled on decision, discharge, follow up and which  symptoms necessitate immediate return to the emergency department. They or their family verbally stated understanding and agreement with plan and discharged in stable condition.   ____________________________________________  FINAL CLINICAL IMPRESSION(S) / ED DIAGNOSES  Final diagnoses:  Multiple closed fractures of pelvis without disruption of pelvic ring, initial encounter ([place])     MEDICATIONS GIVEN DURING THIS VISIT:  Medications  Tdap (BOOSTRIX) injection 0.5 mL (0.5 mLs Intramuscular Given 06/05/19 0235)  oxyCODONE-acetaminophen (PERCOCET/ROXICET) 5-325 MG per tablet 1 tablet (1 tablet Oral Given 06/05/19 0348)     NEW OUTPATIENT MEDICATIONS STARTED DURING THIS VISIT:  Discharge Medication List as of 06/05/2019  8:16 AM    START taking these medications   Details  acetaminophen (TYLENOL) 325 MG tablet Take 2 tablets (650 mg total) by mouth every 6 (six) hours., Starting Sat 06/05/2019, Print    oxyCODONE (ROXICODONE) 5 MG immediate release tablet Take  1 tablet (5 mg total) by mouth every 4 (four) hours as needed for severe pain., Starting Sat 06/05/2019, Print        Note:  This note was prepared with assistance of Dragon voice recognition software. Occasional wrong-word or sound-a-like substitutions may have occurred due to the inherent limitations of voice recognition software.   Laaibah Wartman, Corene Cornea, MD 06/06/19 [phone]

## 2019-06-05 NOTE — ED Triage Notes (Signed)
Pt says that her feet got tangled up in something and she tripped and fell on to her side. She says she does not think that she hit her head, no current dizziness or head pain to palpation. She does have some left hip pain and has an abrasion to the left hand. She says that she feels like her right lower leg is hurting because she had to push herself inside to call for help.

## 2019-06-05 NOTE — ED Notes (Signed)
SON in PT's room waiting for Pt to finish meal then will take her home.

## 2019-06-06 ENCOUNTER — Telehealth: Payer: Self-pay | Admitting: Surgery

## 2019-06-06 NOTE — Telephone Encounter (Signed)
ED CM attempted to contact patient and Daughter  emergency contact to discuss [facility] recommendations.  CM will make 2nd attempt later today.  Laurena Slimmer RN, BSN  ED Care Manager [phone]

## 2019-06-08 ENCOUNTER — Telehealth: Payer: Self-pay | Admitting: Surgery

## 2019-06-10 ENCOUNTER — Telehealth: Payer: Self-pay | Admitting: Surgery

## 2019-06-10 NOTE — Telephone Encounter (Signed)
ED CM received message from daytime case manager concerning a return call from patient about [facility] services.  CM called no answer left message to return call. This patient has [employer] and to not have a delay in services CM discussed with Daughter Jenny Reichmann [phone] that [place] orders were out to [facility] a preferred agency who was able to accept [facility] .  Daughter was agreeable Abran Cantor with [facility] was updated and daughter made aware to expect a call from the nurse at [facility].  No further ED CM needs identified

## 2019-06-11 ENCOUNTER — Telehealth: Payer: Self-pay | Admitting: *Deleted

## 2019-06-11 NOTE — Telephone Encounter (Signed)
Pt daughter called stating mother had been set up with [facility] by ED and [facility] by MD office.  Pt prefers to have MD office request.  [place] notified [facility] [place].

## 2020-07-06 DIAGNOSIS — I6930 Unspecified sequelae of cerebral infarction: Secondary | ICD-10-CM | POA: Diagnosis not present

## 2020-07-06 DIAGNOSIS — D692 Other nonthrombocytopenic purpura: Secondary | ICD-10-CM | POA: Diagnosis not present

## 2020-07-06 DIAGNOSIS — I10 Essential (primary) hypertension: Secondary | ICD-10-CM | POA: Diagnosis not present

## 2020-07-06 DIAGNOSIS — N1831 Chronic kidney disease, stage 3a: Secondary | ICD-10-CM | POA: Diagnosis not present

## 2020-07-06 DIAGNOSIS — I2510 Atherosclerotic heart disease of native coronary artery without angina pectoris: Secondary | ICD-10-CM | POA: Diagnosis not present

## 2020-07-06 DIAGNOSIS — Z1389 Encounter for screening for other disorder: Secondary | ICD-10-CM | POA: Diagnosis not present

## 2020-07-06 DIAGNOSIS — Z0000 Encounter for general adult medical examination without abnormal findings: Secondary | ICD-10-CM | POA: Diagnosis not present

## 2020-07-06 DIAGNOSIS — E039 Hypothyroidism, unspecified: Secondary | ICD-10-CM | POA: Diagnosis not present

## 2020-07-06 DIAGNOSIS — E782 Mixed hyperlipidemia: Secondary | ICD-10-CM | POA: Diagnosis not present

## 2020-09-27 DIAGNOSIS — J209 Acute bronchitis, unspecified: Secondary | ICD-10-CM | POA: Diagnosis not present

## 2020-09-27 DIAGNOSIS — J019 Acute sinusitis, unspecified: Secondary | ICD-10-CM | POA: Diagnosis not present

## 2021-01-11 DIAGNOSIS — Z23 Encounter for immunization: Secondary | ICD-10-CM | POA: Diagnosis not present

## 2021-01-11 DIAGNOSIS — I2510 Atherosclerotic heart disease of native coronary artery without angina pectoris: Secondary | ICD-10-CM | POA: Diagnosis not present

## 2021-01-11 DIAGNOSIS — Z8673 Personal history of transient ischemic attack (TIA), and cerebral infarction without residual deficits: Secondary | ICD-10-CM | POA: Diagnosis not present

## 2021-01-11 DIAGNOSIS — I10 Essential (primary) hypertension: Secondary | ICD-10-CM | POA: Diagnosis not present

## 2021-01-11 DIAGNOSIS — D692 Other nonthrombocytopenic purpura: Secondary | ICD-10-CM | POA: Diagnosis not present

## 2021-01-11 DIAGNOSIS — E039 Hypothyroidism, unspecified: Secondary | ICD-10-CM | POA: Diagnosis not present

## 2021-01-11 DIAGNOSIS — N1831 Chronic kidney disease, stage 3a: Secondary | ICD-10-CM | POA: Diagnosis not present

## 2021-01-11 DIAGNOSIS — E782 Mixed hyperlipidemia: Secondary | ICD-10-CM | POA: Diagnosis not present

## 2021-04-30 ENCOUNTER — Encounter (HOSPITAL_COMMUNITY): Payer: Self-pay | Admitting: Student

## 2021-04-30 ENCOUNTER — Inpatient Hospital Stay (HOSPITAL_COMMUNITY)
Admission: EM | Admit: 2021-04-30 | Discharge: 2021-05-02 | DRG: 064 | Disposition: A | Discharge status: Home / Self Care | Payer: Medicare Other | Attending: Family Medicine | Admitting: Family Medicine

## 2021-04-30 DIAGNOSIS — Z9861 Coronary angioplasty status: Secondary | ICD-10-CM

## 2021-04-30 DIAGNOSIS — R29702 NIHSS score 2: Secondary | ICD-10-CM | POA: Diagnosis present

## 2021-04-30 DIAGNOSIS — Z7902 Long term (current) use of antithrombotics/antiplatelets: Secondary | ICD-10-CM

## 2021-04-30 DIAGNOSIS — I63511 Cerebral infarction due to unspecified occlusion or stenosis of right middle cerebral artery: Secondary | ICD-10-CM | POA: Diagnosis not present

## 2021-04-30 DIAGNOSIS — I4891 Unspecified atrial fibrillation: Secondary | ICD-10-CM | POA: Diagnosis present

## 2021-04-30 DIAGNOSIS — Z9842 Cataract extraction status, left eye: Secondary | ICD-10-CM | POA: Diagnosis not present

## 2021-04-30 DIAGNOSIS — Z79899 Other long term (current) drug therapy: Secondary | ICD-10-CM | POA: Diagnosis not present

## 2021-04-30 DIAGNOSIS — Z9114 Patient's other noncompliance with medication regimen: Secondary | ICD-10-CM

## 2021-04-30 DIAGNOSIS — Z9841 Cataract extraction status, right eye: Secondary | ICD-10-CM | POA: Diagnosis not present

## 2021-04-30 DIAGNOSIS — I639 Cerebral infarction, unspecified: Secondary | ICD-10-CM | POA: Diagnosis not present

## 2021-04-30 DIAGNOSIS — Z8673 Personal history of transient ischemic attack (TIA), and cerebral infarction without residual deficits: Secondary | ICD-10-CM | POA: Diagnosis not present

## 2021-04-30 DIAGNOSIS — N390 Urinary tract infection, site not specified: Secondary | ICD-10-CM | POA: Diagnosis not present

## 2021-04-30 DIAGNOSIS — G9341 Metabolic encephalopathy: Secondary | ICD-10-CM | POA: Diagnosis not present

## 2021-04-30 DIAGNOSIS — I6389 Other cerebral infarction: Secondary | ICD-10-CM | POA: Diagnosis not present

## 2021-04-30 DIAGNOSIS — I10 Essential (primary) hypertension: Secondary | ICD-10-CM | POA: Diagnosis not present

## 2021-04-30 DIAGNOSIS — I6381 Other cerebral infarction due to occlusion or stenosis of small artery: Principal | ICD-10-CM | POA: Diagnosis present

## 2021-04-30 DIAGNOSIS — E785 Hyperlipidemia, unspecified: Secondary | ICD-10-CM | POA: Diagnosis not present

## 2021-04-30 DIAGNOSIS — Z20822 Contact with and (suspected) exposure to covid-19: Secondary | ICD-10-CM | POA: Diagnosis present

## 2021-04-30 DIAGNOSIS — R29818 Other symptoms and signs involving the nervous system: Secondary | ICD-10-CM | POA: Diagnosis not present

## 2021-04-30 DIAGNOSIS — E039 Hypothyroidism, unspecified: Secondary | ICD-10-CM | POA: Diagnosis not present

## 2021-04-30 DIAGNOSIS — Z823 Family history of stroke: Secondary | ICD-10-CM

## 2021-04-30 DIAGNOSIS — N3001 Acute cystitis with hematuria: Secondary | ICD-10-CM | POA: Diagnosis not present

## 2021-04-30 DIAGNOSIS — R410 Disorientation, unspecified: Secondary | ICD-10-CM | POA: Diagnosis not present

## 2021-04-30 DIAGNOSIS — R531 Weakness: Secondary | ICD-10-CM | POA: Diagnosis not present

## 2021-04-30 DIAGNOSIS — Z79890 Hormone replacement therapy (postmenopausal): Secondary | ICD-10-CM

## 2021-04-30 NOTE — ED Triage Notes (Addendum)
Patient arrives by son, son found her laying on ground, LKW was sometime 1/14 , did not recognize son, rambling. Patient currently alert and oriented x2 Baseline is alert and oriented x4. Unsure if fall.

## 2021-04-30 NOTE — ED Provider Triage Note (Signed)
Emergency Medicine Provider Triage Evaluation Note  Brooke Larson , a 88 y.o. female  was evaluated in triage.  Patient presents with her son for evaluation of AMS. Patient's son provides history- relays that yesterday she seemed confused throughout the day, today notably worse. He found her laying on the ground, she did not know his name, she tried to eat her food with her fingers instead of a fork, and was rambling. No alleviating/aggravating factors. Last known normal was the AM of 04/28/20. He has not noted any slurred speech or problem producing words. Patient is not sure why she is here.   Review of Systems  As noted per HPI.   Physical Exam  BP (!) 192/89    Pulse 98    Temp 98.8 F (37.1 C)    Resp 20    SpO2 98%  Gen:   Awake, no distress   Resp:  Normal effort  MSK:   Moves extremities without difficulty  Other:  PERRL. No obvious facial droop. Confused. Oriented to person & place, disoriented to year. Sensation grossly intact x 4. Very mild LUE pronator drift and weakness with grip strength compared to the RUE. Patient having trouble following commands. She is ambulatory without obvious ataxia.   Medical Decision Making  Medically screening exam initiated at 11:36 PM.  Appropriate orders placed.  Brooke Larson was informed that the remainder of the evaluation will be completed by another provider, this initial triage assessment does not replace that evaluation, and the importance of remaining in the ED until their evaluation is complete.  AMS.  Some very minimal L sided weakness on exam, notably confused, not ataxic. Last known normal > 24 hours prior therefore no code stroke activated, attending in agreement. Work-up initiated from triage.    Brooke Larson, [place] 05/01/21 [phone]

## 2021-05-01 ENCOUNTER — Inpatient Hospital Stay (HOSPITAL_COMMUNITY): Payer: Medicare Other

## 2021-05-01 ENCOUNTER — Emergency Department (HOSPITAL_COMMUNITY): Payer: Medicare Other

## 2021-05-01 ENCOUNTER — Other Ambulatory Visit (HOSPITAL_COMMUNITY): Payer: Medicare Other

## 2021-05-01 DIAGNOSIS — N390 Urinary tract infection, site not specified: Secondary | ICD-10-CM | POA: Diagnosis present

## 2021-05-01 DIAGNOSIS — N3001 Acute cystitis with hematuria: Secondary | ICD-10-CM

## 2021-05-01 DIAGNOSIS — I6389 Other cerebral infarction: Secondary | ICD-10-CM | POA: Diagnosis not present

## 2021-05-01 DIAGNOSIS — I4891 Unspecified atrial fibrillation: Secondary | ICD-10-CM | POA: Diagnosis present

## 2021-05-01 DIAGNOSIS — Z79890 Hormone replacement therapy (postmenopausal): Secondary | ICD-10-CM | POA: Diagnosis not present

## 2021-05-01 DIAGNOSIS — I10 Essential (primary) hypertension: Secondary | ICD-10-CM | POA: Diagnosis present

## 2021-05-01 DIAGNOSIS — Z9842 Cataract extraction status, left eye: Secondary | ICD-10-CM | POA: Diagnosis not present

## 2021-05-01 DIAGNOSIS — Z9114 Patient's other noncompliance with medication regimen: Secondary | ICD-10-CM | POA: Diagnosis not present

## 2021-05-01 DIAGNOSIS — Z20822 Contact with and (suspected) exposure to covid-19: Secondary | ICD-10-CM | POA: Diagnosis present

## 2021-05-01 DIAGNOSIS — E785 Hyperlipidemia, unspecified: Secondary | ICD-10-CM | POA: Diagnosis not present

## 2021-05-01 DIAGNOSIS — E039 Hypothyroidism, unspecified: Secondary | ICD-10-CM | POA: Diagnosis not present

## 2021-05-01 DIAGNOSIS — G9341 Metabolic encephalopathy: Secondary | ICD-10-CM

## 2021-05-01 DIAGNOSIS — Z7902 Long term (current) use of antithrombotics/antiplatelets: Secondary | ICD-10-CM | POA: Diagnosis not present

## 2021-05-01 DIAGNOSIS — Z79899 Other long term (current) drug therapy: Secondary | ICD-10-CM | POA: Diagnosis not present

## 2021-05-01 DIAGNOSIS — I639 Cerebral infarction, unspecified: Secondary | ICD-10-CM | POA: Diagnosis not present

## 2021-05-01 DIAGNOSIS — R29702 NIHSS score 2: Secondary | ICD-10-CM | POA: Diagnosis present

## 2021-05-01 DIAGNOSIS — I6381 Other cerebral infarction due to occlusion or stenosis of small artery: Principal | ICD-10-CM

## 2021-05-01 DIAGNOSIS — Z8673 Personal history of transient ischemic attack (TIA), and cerebral infarction without residual deficits: Secondary | ICD-10-CM | POA: Diagnosis not present

## 2021-05-01 DIAGNOSIS — Z9841 Cataract extraction status, right eye: Secondary | ICD-10-CM | POA: Diagnosis not present

## 2021-05-01 DIAGNOSIS — Z823 Family history of stroke: Secondary | ICD-10-CM | POA: Diagnosis not present

## 2021-05-01 DIAGNOSIS — Z9861 Coronary angioplasty status: Secondary | ICD-10-CM | POA: Diagnosis not present

## 2021-05-01 LAB — APTT: aPTT: 26 seconds (ref 24–36)

## 2021-05-01 LAB — COMPREHENSIVE METABOLIC PANEL
ALT: 12 U/L (ref 0–44)
AST: 19 U/L (ref 15–41)
Albumin: 4 g/dL (ref 3.5–5.0)
Alkaline Phosphatase: 60 U/L (ref 38–126)
Anion gap: 7 (ref 5–15)
BUN: 18 mg/dL (ref 8–23)
CO2: 27 mmol/L (ref 22–32)
Calcium: 9.4 mg/dL (ref 8.9–10.3)
Chloride: 105 mmol/L (ref 98–111)
Creatinine, Ser: 1.12 mg/dL — ABNORMAL HIGH (ref 0.44–1.00)
GFR, Estimated: 48 mL/min — ABNORMAL LOW (ref >60)
Glucose, Bld: 117 mg/dL — ABNORMAL HIGH (ref 70–99)
Potassium: 3.7 mmol/L (ref 3.5–5.1)
Sodium: 139 mmol/L (ref 135–145)
Total Bilirubin: 1.8 mg/dL — ABNORMAL HIGH (ref 0.3–1.2)
Total Protein: 6.6 g/dL (ref 6.5–8.1)

## 2021-05-01 LAB — HEMOGLOBIN A1C
Hgb A1c MFr Bld: 5.1 % (ref 4.8–5.6)
Mean Plasma Glucose: 99.67 mg/dL

## 2021-05-01 LAB — ETHANOL: Alcohol, Ethyl (B): <10 mg/dL (ref <10)

## 2021-05-01 LAB — URINALYSIS, MICROSCOPIC (REFLEX): WBC, UA: >50 WBC/hpf (ref 0–5)

## 2021-05-01 LAB — I-STAT CHEM 8, ED
BUN: 21 mg/dL (ref 8–23)
Calcium, Ion: 1.17 mmol/L (ref 1.15–1.40)
Chloride: 103 mmol/L (ref 98–111)
Creatinine, Ser: 1 mg/dL (ref 0.44–1.00)
Glucose, Bld: 109 mg/dL — ABNORMAL HIGH (ref 70–99)
HCT: 40 % (ref 36.0–46.0)
Hemoglobin: 13.6 g/dL (ref 12.0–15.0)
Potassium: 3.7 mmol/L (ref 3.5–5.1)
Sodium: 141 mmol/L (ref 135–145)
TCO2: 29 mmol/L (ref 22–32)

## 2021-05-01 LAB — CBC
HCT: 41.8 % (ref 36.0–46.0)
Hemoglobin: 14.2 g/dL (ref 12.0–15.0)
MCH: 32.6 pg (ref 26.0–34.0)
MCHC: 34 g/dL (ref 30.0–36.0)
MCV: 96.1 fL (ref 80.0–100.0)
Platelets: 198 10*3/uL (ref 150–400)
RBC: 4.35 MIL/uL (ref 3.87–5.11)
RDW: 11.9 % (ref 11.5–15.5)
WBC: 8.7 10*3/uL (ref 4.0–10.5)
nRBC: 0 % (ref 0.0–0.2)

## 2021-05-01 LAB — LIPID PANEL
Cholesterol: 237 mg/dL — ABNORMAL HIGH (ref 0–200)
HDL: 52 mg/dL (ref >40)
LDL Cholesterol: 161 mg/dL — ABNORMAL HIGH (ref 0–99)
Total CHOL/HDL Ratio: 4.6 RATIO
Triglycerides: 120 mg/dL (ref <150)
VLDL: 24 mg/dL (ref 0–40)

## 2021-05-01 LAB — ECHOCARDIOGRAM COMPLETE
AR max vel: 1.32 cm2
AV Area VTI: 1.45 cm2
AV Area mean vel: 1.39 cm2
AV Mean grad: 17 mmHg
AV Peak grad: 25.2 mmHg
Ao pk vel: 2.51 m/s
Area-P 1/2: 2.32 cm2
Calc EF: 63 %
MV VTI: 1.78 cm2
S' Lateral: 2.9 cm
Single Plane A2C EF: 65 %
Single Plane A4C EF: 62.2 %

## 2021-05-01 LAB — URINALYSIS, ROUTINE W REFLEX MICROSCOPIC
Bilirubin Urine: NEGATIVE
Glucose, UA: NEGATIVE mg/dL
Ketones, ur: NEGATIVE mg/dL
Nitrite: NEGATIVE
Protein, ur: NEGATIVE mg/dL
Specific Gravity, Urine: 1.02 (ref 1.005–1.030)
pH: 6 (ref 5.0–8.0)

## 2021-05-01 LAB — PROTIME-INR
INR: 1 (ref 0.8–1.2)
Prothrombin Time: 13.4 seconds (ref 11.4–15.2)

## 2021-05-01 LAB — RAPID URINE DRUG SCREEN, HOSP PERFORMED
Amphetamines: NOT DETECTED
Barbiturates: NOT DETECTED
Benzodiazepines: NOT DETECTED
Cocaine: NOT DETECTED
Opiates: NOT DETECTED
Tetrahydrocannabinol: NOT DETECTED

## 2021-05-01 LAB — DIFFERENTIAL
Abs Immature Granulocytes: 0.03 10*3/uL (ref 0.00–0.07)
Basophils Absolute: 0 10*3/uL (ref 0.0–0.1)
Basophils Relative: 1 %
Eosinophils Absolute: 0 10*3/uL (ref 0.0–0.5)
Eosinophils Relative: 0 %
Immature Granulocytes: 0 %
Lymphocytes Relative: 19 %
Lymphs Abs: 1.6 10*3/uL (ref 0.7–4.0)
Monocytes Absolute: 0.9 10*3/uL (ref 0.1–1.0)
Monocytes Relative: 10 %
Neutro Abs: 6.1 10*3/uL (ref 1.7–7.7)
Neutrophils Relative %: 70 %

## 2021-05-01 LAB — AMMONIA: Ammonia: 17 umol/L (ref 9–35)

## 2021-05-01 LAB — FOLATE: Folate: 22.2 ng/mL (ref >5.9)

## 2021-05-01 LAB — VITAMIN B12: Vitamin B-12: 259 pg/mL (ref 180–914)

## 2021-05-01 LAB — TSH: TSH: 2.239 u[IU]/mL (ref 0.350–4.500)

## 2021-05-01 LAB — RESP PANEL BY RT-PCR (FLU A&B, COVID) ARPGX2
Influenza A by PCR: NEGATIVE
Influenza B by PCR: NEGATIVE
SARS Coronavirus 2 by RT PCR: NEGATIVE

## 2021-05-01 MED ORDER — ENOXAPARIN SODIUM 40 MG/0.4ML IJ SOSY
40.0000 mg | PREFILLED_SYRINGE | Freq: Every day | INTRAMUSCULAR | Status: DC
  Administered 2021-05-01: 40 mg via SUBCUTANEOUS
  Filled 2021-05-01: qty 0.4

## 2021-05-01 MED ORDER — HEPARIN (PORCINE) 25000 UT/250ML-% IV SOLN
600.0000 [IU]/h | INTRAVENOUS | Status: DC
  Administered 2021-05-01: 20:00:00 750 [IU]/h via INTRAVENOUS
  Filled 2021-05-01: qty 250

## 2021-05-01 MED ORDER — ACETAMINOPHEN 325 MG PO TABS: 650.0000 mg | ORAL_TABLET | ORAL | Status: DC | PRN

## 2021-05-01 MED ORDER — ASPIRIN EC 81 MG PO TBEC
81.0000 mg | DELAYED_RELEASE_TABLET | Freq: Every day | ORAL | Status: DC
  Administered 2021-05-01: 81 mg via ORAL
  Filled 2021-05-01: qty 1

## 2021-05-01 MED ORDER — CYANOCOBALAMIN 1000 MCG/ML IJ SOLN
1000.0000 ug | Freq: Every day | INTRAMUSCULAR | Status: DC
  Administered 2021-05-01 – 2021-05-02 (×2): 1000 ug via INTRAMUSCULAR
  Filled 2021-05-01 (×3): qty 1

## 2021-05-01 MED ORDER — ACETAMINOPHEN 650 MG RE SUPP: 650.0000 mg | RECTAL | Status: DC | PRN

## 2021-05-01 MED ORDER — ADULT MULTIVITAMIN W/MINERALS CH
1.0000 | ORAL_TABLET | Freq: Every day | ORAL | Status: DC
Start: 2021-05-01 — End: 2021-05-02
  Administered 2021-05-01 – 2021-05-02 (×2): 1 via ORAL
  Filled 2021-05-01 (×2): qty 1

## 2021-05-01 MED ORDER — ACETAMINOPHEN 160 MG/5ML PO SOLN: 650.0000 mg | ORAL | Status: DC | PRN

## 2021-05-01 MED ORDER — LEVOTHYROXINE SODIUM 50 MCG PO TABS
50.0000 ug | ORAL_TABLET | Freq: Every day | ORAL | Status: DC
  Administered 2021-05-02: 50 ug via ORAL
  Filled 2021-05-01: qty 1

## 2021-05-01 MED ORDER — LORAZEPAM 2 MG/ML IJ SOLN: 0.5000 mg | Freq: Four times a day (QID) | INTRAMUSCULAR | Status: DC | PRN

## 2021-05-01 MED ORDER — ATORVASTATIN CALCIUM 80 MG PO TABS
80.0000 mg | ORAL_TABLET | Freq: Every day | ORAL | Status: DC
  Administered 2021-05-01 – 2021-05-02 (×2): 80 mg via ORAL
  Filled 2021-05-01: qty 1
  Filled 2021-05-01: qty 2

## 2021-05-01 MED ORDER — SODIUM CHLORIDE 0.9 % IV SOLN
1.0000 g | INTRAVENOUS | Status: DC
  Administered 2021-05-01: 1 g via INTRAVENOUS
  Filled 2021-05-01: qty 10

## 2021-05-01 MED ORDER — SODIUM CHLORIDE 0.9 % IV BOLUS
1000.0000 mL | Freq: Once | INTRAVENOUS | Status: AC
  Administered 2021-05-01: 1000 mL via INTRAVENOUS

## 2021-05-01 MED ORDER — STROKE: EARLY STAGES OF RECOVERY BOOK
Freq: Once | Status: AC
  Filled 2021-05-01 (×2): qty 1

## 2021-05-01 MED ORDER — CLOPIDOGREL BISULFATE 75 MG PO TABS
75.0000 mg | ORAL_TABLET | Freq: Every day | ORAL | Status: DC
  Administered 2021-05-01: 75 mg via ORAL
  Filled 2021-05-01: qty 1

## 2021-05-01 NOTE — Progress Notes (Addendum)
ANTICOAGULATION CONSULT NOTE - Initial Consult  Pharmacy Consult for IV Heparin Indication: atrial fibrillation  No Known Allergies  Patient Measurements: Height: 5\' 2"  (157.5 cm) Weight: 55 kg (121 lb 4.1 oz) IBW/kg (Calculated) : 50.1 Heparin Dosing Weight:  55 kg  Vital Signs: Temp: 98.1 F (36.7 C) (01/17 1602) Temp Source: Oral (01/17 1602) BP: 171/79 (01/17 1602) Pulse Rate: 67 (01/17 1602)  Labs: Recent Labs    04/30/21 2347 05/01/21 0019  HGB 14.2 13.6  HCT 41.8 40.0  PLT 198  --   APTT 26  --   LABPROT 13.4  --   INR 1.0  --   CREATININE 1.12* 1.00    Estimated Creatinine Clearance: 31.3 mL/min (by C-G formula based on SCr of 1 mg/dL).   Medical History: Past Medical History:  Diagnosis Date   Hyperlipemia    Hypertension    Thyroid disease     Assessment: 88 yr old woman admitted on 04/30/21 with CVA (hx of stroke in 2015, on Plavix). No evidence of intracranial hemorrhage on CT or MRI. Pharmacy is consulted to dose IV heparin for atrial fibrillation. Pt rec'd enoxaparin 40 mg SQ X 1 ~1500 this afternoon.  H/H 13.6/40.0, plt 198  Goal of Therapy:  Heparin level 0.3-0.5 units/ml Monitor platelets by anticoagulation protocol: Yes   Plan:  Discontinue enoxaparin Start heparin infusion (no bolus) at 750 units/hr Check 8-hr heparin level Monitor daily heparin level, CBC Monitor for bleeding F/U long term anticoagulation plan  Gillermina Hu, PharmD, BCPS, [facility] Clinical Pharmacist 05/01/2021,5:55 PM

## 2021-05-01 NOTE — Consult Note (Signed)
Neurology consult   CC: Stroke found on MRI brain.   Referring MD: Roderic Palau, MD.   History is obtained from: Patient,chart, and Brooke Larson.   HPI: Brooke Larson is a 88 yo female with a PMHx of hypothyroidism, stroke in 2015 with no sequelae on Plavix, HLD, and HTN. Patient presented today with confusion with high BPs in 190s. Her last seen normal was 04/28/21. Patient lives alone. Brooke Larson found her to be confused on the 16th and 17th. Brooke Larson brought her in. Patient states the last thing she remembers is eating, but can not tell me the day. She does not remember coming here or what happened to make her come here. Brooke Larson states her mental status has improved a lot, but not back to baseline.   Workup thus far shows a lacunar infarct and 2 other small acute infarcts in the L MCA. However, this would not cause the confusion to the extent the patient has/had. Patient/Brooke Larson states patient does not take her meds correctly. Skips doses of Synthroid and Plavix. She has a pill box, but Brooke Larson will find doses still in box.   Brooke Larson states her old stroke was 60 at [facility]. NP only sees a refill for Plavix that year and no other notes.   Neurology asked to see due to new finding of stroke on MRI brain.   LKW:  04/28/21           TNK given?: No OSW.  IR Thrombectomy?: No, no suspicion for LVO.  MRS: 1  NIHSS:  1a Level of Consciousness: 0 1b LOC Questions: 1 1c LOC Commands: 0 2 Best Gaze: 0 3 Visual: 0 4 Facial Palsy: 0 5a Motor Arm - left: 0 5b Motor Arm - Right: 0 6a Motor Leg - Left: 0 6b Motor Leg - Right: 0 7 Limb Ataxia: 0 8 Sensory: 0 9 Best Language: 1 10 Dysarthria: 0 11 Extinction and Inattention: 0 TOTAL:  2  ROS: Robust ROS negative except as noted in HPI.    Past Medical History:  Diagnosis Date   Hyperlipemia    Hypertension    Thyroid disease    Family History  Problem Relation Age of Onset   Breast cancer Mother    Alzheimer's disease Mother    Stroke Father     Social History:  reports that she has never smoked. She does not have any smokeless tobacco history on file. She reports that she does not drink alcohol and does not use drugs.  Prior to Admission medications   Medication Sig Start Date End Date Taking? Authorizing Provider  carvedilol (COREG) 12.5 MG tablet Take 12.5 mg by mouth 2 (two) times daily with a meal.   Yes [provider]  clopidogrel (PLAVIX) 75 MG tablet Take 75 mg by mouth every evening. 12/29/13  Yes [provider]  levothyroxine (SYNTHROID) 50 MCG tablet Take 50 mcg by mouth daily before breakfast.   Yes [provider]  valsartan (DIOVAN) 40 MG tablet Take 40 mg by mouth daily. 01/17/21  Yes [provider]    Exam: Current vital signs: BP 139/62    Pulse 64    Temp 98.2 F (36.8 C) (Oral)    Resp 15    SpO2 98%   Physical Exam  Constitutional: Appears well-developed and well-nourished.  Psych: Affect appropriate to situation. Eyes: No scleral injection. HENT: No OP obstruction. Head: Normocephalic.  Cardiovascular: Normal rate and regular rhythm.  Respiratory: Effort normal.  GI: Abdomen soft.  No distension. There  is no tenderness.  Skin: WDI.  Neuro: Mental Status: Patient is awake, alert, oriented to her name, place, city, state, month. Knows her birthday. Says she is 86, day is Wednesday, date (doesn't know). Follows commands. Patient is eating but saw her trying to suck her spoon like a straw.  Patient is unable to give a clear and coherent history. No signs of neglect. Speech/Language:  Speech is clear, fluent without dysarthria but a tad of expressive aphasia with circumferential speech. She does not know how many quarters are in $2.75. She says a bike and a car are alike because you can ride in them.  Cranial Nerves: II: Visual Fields are full. Pupils are equal, round, and reactive to light.  III,IV, VI: EOMI without ptosis or diploplia.  V: Facial sensation is  symmetric to light touch in V1, V2, and V3 VII: Facial movement symmetrical.  VIII: hearing is intact to voice. X: Uvula elevates symmetrically. XI: Shoulder shrug is symmetric. XII: tongue is midline without atrophy or fasciculations.  Motor: 5/5 throughout.  Sensory: Sensation is symmetric to light touch in all fours extremities.  Plantars: Toes are downgoing bilaterally.  Cerebellar: No ataxia noted with FNF.   I have reviewed labs in epic and the pertinent results are: creatinine 1.      LDL 161.     Folate 22.2.      Vit B12 259.   MD reviewed the images obtained:  NCT head  Chronic atrophic and ischemic changes without acute abnormality.  MRI brain Acute on chronic small vessel disease: - acute lacunar infarct in the posterior left lentiform with No associated hemorrhage or mass effect.  - and 2 punctate acute left MCA territory white matter infarcts also. - progressed underlying advanced small vessel disease, and right MCA territory ischemia since 2016. Chronic microhemorrhages in the pons.  12 lead EKG + Afib.   Assessment: 88 yo female with stroke risks of prior stroke, advanced age, non adherence with medications especially Plavix. Afib on EKG, but her stroke does not appear embolic. Echo pending to check for thrombus/PFO. She does have ischemic changes and acute on chronic small vessel disease which are related to her HTN. As far as her encephalopathy, doubt strokes are the cause. She doesn't eat well, so we will check some Vitamins. B12 is low, so will replete. Given her non adherence with Synthroid, we checked TSH and it is normal.   Impression:  -small acute strokes, likely not the cause of her encephalopathy, and likely ischemic even though, new AF is discovered.  -encephalopathy, unknown cause.   Plan: -Medicine admit.  - Recommend vascular imaging with MRA head and neck. - Recommend TTE. - Recommend labs: HbA1c.  -Add statin for LDL of 161. Lipitor 80mg  po  qd.  - Aspirin 81mg  daily. - Clopidogrel 75mg  daily. Has been on this since 2015.   - SBP goal - Permissive hypertension first 24 h < 220/110. Hold home medications for now. - Telemetry monitoring for arrhythmia. -AF per medicine team.  - bedside Swallow screen. - Stroke education. - PT/OT/SLP consult. - NIHSS as per protocol. - frequent neuro checks.  -replete Vit B12.  -check Vit D and replete if low.  -Consider urine culture as this is a likely cause for encephalopathy in elderly.  -stroke team to follow.   Patient seen by Brooke Boll, MSN, APN-BC, nurse practitioner and by MD. Note/plan to be edited by MD as needed.  Pager: [phone]   NEUROHOSPITALIST ADDENDUM  Performed a face to face diagnostic evaluation.   I have reviewed the contents of history and physical exam as documented by PA/ARNP/Resident and agree with above documentation.  I have discussed and formulated the above plan as documented. Edits to the note have been made as needed.  Impression/Key exam findings/Plan: brought in by Brooke Larson for encephalopathy x 2-3 days. Found to have a small lacunar stroke in the left lentiform nucleus which I think is incidental and unlikely to be causing her presentation. Recommend workup for potential infection along with encephalopathy labs with B12, folate, TSH, ammonia.  Of note, tele at bedside concerning for Afibb and thus 12 lead EKG obtained with no clear P waves on my brief review and possible Afibb?  Donnetta Simpers, MD [facility] [phone]   If 7pm to 7am, please call on call as listed on AMION.

## 2021-05-01 NOTE — ED Notes (Signed)
Patient transported to MRI 

## 2021-05-01 NOTE — ED Provider Notes (Signed)
[facility] EMERGENCY DEPARTMENT Provider Note   CSN: [id number] Arrival date & time: 04/30/21  2250     History  Chief Complaint  Patient presents with   Altered Mental Status    Brooke Larson is a 88 y.o. female.  Patient has a history of an old stroke along with hypertension and hyperlipidemia.  Her last seen normal was on January 14.  Her son states on the 15th she seems confused along with on the 16th.  She is getting a little bit better according to the son  The history is provided by the patient and a relative. No language interpreter was used.  Altered Mental Status Presenting symptoms: confusion   Severity:  Mild Most recent episode:  2 days ago Episode history:  Continuous Timing:  Unable to specify Progression:  Improving Chronicity:  New Context: not alcohol use   Associated symptoms: no abdominal pain, no hallucinations, no headaches, no rash and no seizures       Home Medications Prior to Admission medications   Medication Sig Start Date End Date Taking? Authorizing Provider  carvedilol (COREG) 12.5 MG tablet Take 12.5 mg by mouth 2 (two) times daily with a meal.   Yes [provider]  clopidogrel (PLAVIX) 75 MG tablet Take 75 mg by mouth every evening. 12/29/13  Yes [provider]  levothyroxine (SYNTHROID) 50 MCG tablet Take 50 mcg by mouth daily before breakfast.   Yes [provider]  valsartan (DIOVAN) 40 MG tablet Take 40 mg by mouth daily. 01/17/21  Yes [provider]      Allergies    Patient has no known allergies.    Review of Systems   Review of Systems  Constitutional:  Negative for appetite change and fatigue.  HENT:  Negative for congestion, ear discharge and sinus pressure.   Eyes:  Negative for discharge.  Respiratory:  Negative for cough.   Cardiovascular:  Negative for chest pain.  Gastrointestinal:  Negative for abdominal pain and diarrhea.  Genitourinary:  Negative for  frequency and hematuria.  Musculoskeletal:  Negative for back pain.  Skin:  Negative for rash.  Neurological:  Negative for seizures and headaches.  Psychiatric/Behavioral:  Positive for confusion. Negative for hallucinations.    Physical Exam Updated Vital Signs BP (!) 172/68 (BP Location: Right Arm)    Pulse 73    Temp 98.2 F (36.8 C) (Oral)    Resp 17    SpO2 94%  Physical Exam Vitals and nursing note reviewed.  Constitutional:      Appearance: She is well-developed.  HENT:     Head: Normocephalic.     Nose: Nose normal.  Eyes:     General: No scleral icterus.    Conjunctiva/sclera: Conjunctivae normal.  Neck:     Thyroid: No thyromegaly.  Cardiovascular:     Rate and Rhythm: Normal rate and regular rhythm.     Heart sounds: No murmur heard.   No friction rub. No gallop.  Pulmonary:     Breath sounds: No stridor. No wheezing or rales.  Chest:     Chest wall: No tenderness.  Abdominal:     General: There is no distension.     Tenderness: There is no abdominal tenderness. There is no rebound.  Musculoskeletal:        General: Normal range of motion.     Cervical back: Neck supple.  Lymphadenopathy:     Cervical: No cervical adenopathy.  Skin:    Findings:  No erythema or rash.  Neurological:     Mental Status: She is alert.     Motor: No abnormal muscle tone.     Coordination: Coordination normal.     Comments: Patient oriented to person place.  She is mildly confused unsure of the date  Psychiatric:        Behavior: Behavior normal.    ED Results / Procedures / Treatments   Labs (all labs ordered are listed, but only abnormal results are displayed) Labs Reviewed  COMPREHENSIVE METABOLIC PANEL - Abnormal; Notable for the following components:      Result Value   Glucose, Bld 117 (*)    Creatinine, Ser 1.12 (*)    Total Bilirubin 1.8 (*)    GFR, Estimated 48 (*)    All other components within normal limits  I-STAT CHEM 8, ED - Abnormal; Notable for the  following components:   Glucose, Bld 109 (*)    All other components within normal limits  RESP PANEL BY RT-PCR (FLU A&B, COVID) ARPGX2  ETHANOL  PROTIME-INR  APTT  CBC  DIFFERENTIAL  RAPID URINE DRUG SCREEN, HOSP PERFORMED  URINALYSIS, ROUTINE W REFLEX MICROSCOPIC  AMMONIA  TSH  VITAMIN B12  VITAMIN B1  FOLATE    EKG None  Radiology CT Head Wo Contrast  Result Date: 05/01/2021 CLINICAL DATA:  Delirium EXAM: CT HEAD WITHOUT CONTRAST TECHNIQUE: Contiguous axial images were obtained from the base of the skull through the vertex without intravenous contrast. RADIATION DOSE REDUCTION: This exam was performed according to the departmental dose-optimization program which includes automated exposure control, adjustment of the mA and/or kV according to patient size and/or use of iterative reconstruction technique. COMPARISON:  06/05/2019 FINDINGS: Brain: No evidence of acute infarction, hemorrhage, hydrocephalus, extra-axial collection or mass lesion/mass effect. Mild atrophic changes and chronic white matter ischemic change is seen. Vascular: No hyperdense vessel or unexpected calcification. Skull: Normal. Negative for fracture or focal lesion. Sinuses/Orbits: No acute finding. Other: None. IMPRESSION: Chronic atrophic and ischemic changes without acute abnormality. Electronically Signed   By: Inez Catalina M.D.   On: 05/01/2021 00:11   MR BRAIN WO CONTRAST  Result Date: 05/01/2021 CLINICAL DATA:  88 year old female with delirium, altered mental status, found down. Neurologic deficit. EXAM: MRI HEAD WITHOUT CONTRAST TECHNIQUE: Multiplanar, multiecho pulse sequences of the brain and surrounding structures were obtained without intravenous contrast. COMPARISON:  Head CT 0003 hours today.  Brain MRI 10/10/2014. FINDINGS: Brain: Linear roughly 13 mm area of restricted diffusion in the posterior left lentiform and/or posterior deep white matter capsules (series 5, image 80). And there are 2 additional  punctate foci of left MCA territory white matter diffusion restriction (series 5, image 88). Associated T2 and FLAIR hyperintensity with no acute hemorrhage or mass effect. Chronic right MCA territory cortical encephalomalacia involving the right middle frontal gyrus is new since 2016. And widespread bilateral cerebral white matter T2 and FLAIR hyperintensity has progressed since that time. Similar chronic but increased T2 and FLAIR heterogeneity throughout the bilateral deep gray matter nuclei likely a combination of perivascular spaces and chronic lacunar infarcts (definite right thalamic chronic lacune on series 10, image 14). Similar heterogeneity in the pons appears increased. And several chronic microhemorrhages in the pons are new since 2016. No other chronic cerebral blood products or cortical encephalomalacia identified. No midline shift, mass effect, evidence of mass lesion, ventriculomegaly, extra-axial collection or acute intracranial hemorrhage. Cervicomedullary junction and pituitary are within normal limits. Vascular: Major intracranial vascular flow voids are  preserved. There is mild to moderate generalized intracranial artery tortuosity. Skull and upper cervical spine: Negative for age visible cervical spine. Normal bone marrow signal. Sinuses/Orbits: Negative, postoperative changes to both globes. Other: Mastoids are well aerated. Grossly normal visible internal auditory structures. Negative visible scalp and face. IMPRESSION: Acute on chronic small vessel disease: - acute lacunar infarct in the posterior left lentiform with No associated hemorrhage or mass effect. - and 2 punctate acute left MCA territory white matter infarcts also. - progressed underlying advanced small vessel disease, and right MCA territory ischemia since 2016. Chronic microhemorrhages in the pons. Electronically Signed   By: Genevie Ann M.D.   On: 05/01/2021 06:55   DG Chest Port 1 View  Result Date: 05/01/2021 CLINICAL DATA:   Weakness.  Altered mental status and confusion. EXAM: PORTABLE CHEST 1 VIEW COMPARISON:  05/13/2017 FINDINGS: Normal heart size. No pleural effusion or edema. No airspace opacities identified. The visualized osseous structures are unremarkable. IMPRESSION: No acute cardiopulmonary abnormalities. Electronically Signed   By: Kerby Moors M.D.   On: 05/01/2021 11:05    Procedures Procedures    Medications Ordered in ED Medications  sodium chloride 0.9 % bolus 1,000 mL (has no administration in time range)    ED Course/ Medical Decision Making/ A&P                           Medical Decision Making Amount and/or Complexity of Data Reviewed Radiology: ordered.  Risk Decision regarding hospitalization.   Patient with a lacunar infarct and 2 small left white matter infarcts, I spoke with neurology and they will consult on the patient and requested medicine admission    ,mil This patient presents to the ED for concern of confusion, this involves an extensive number of treatment options, and is a complaint that carries with it a high risk of complications and morbidity.  The differential diagnosis includes stroke, UTI, sepsis   Co morbidities that complicate the patient evaluation  Hypertension, hyperlipidemia, old stroke   Additional history obtained:  Additional history obtained from son External records from outside source obtained and reviewed including hospital record   Lab Tests:  I Ordered, and personally interpreted labs.  The pertinent results include: Urinalysis shows UTI   Imaging Studies ordered:  I ordered imaging studies including CT of the head I independently visualized and interpreted imaging which showed new strokes I agree with the radiologist interpretation   Cardiac Monitoring:  The patient was maintained on a cardiac monitor.  I personally viewed and interpreted the cardiac monitored which showed an underlying rhythm of: Normal sinus  rhythm   Medicines ordered and prescription drug management:  I ordered medication including normal saline bolus Reevaluation of the patient after these medicines showed that the patient stayed the same I have reviewed the patients home medicines and have made adjustments as needed   Test Considered:  MRI   Critical Interventions:  Consult neurology   Consultations Obtained:  I requested consultation with the neurology and hospitalist,  and discussed lab and imaging findings as well as pertinent plan - they recommend: Hospitalist will admit and neurology recommends admission also   Problem List / ED Course:  New strokes, hypertension   Reevaluation:  After the interventions noted above, I reevaluated the patient and found that they have :stayed the same   Social Determinants of Health:  Old stroke   Dispostion:  After consideration of the diagnostic results and the patients  response to treatment, I feel that the patent would benefit from admission and work-up for stroke.         Final Clinical Impression(s) / ED Diagnoses Final diagnoses:  Lacunar stroke, acute [facility])    Rx / DC Orders ED Discharge Orders     None         Milton Ferguson, MD 05/03/21 1130

## 2021-05-01 NOTE — H&P (Addendum)
History and Physical    Brooke Larson:[id number] DOB: 03-15-1932 DOA: 04/30/2021  Referring MD/NP/PA: Milton Ferguson, MD PCP: Carol Ada, MD  Patient coming from: home (lives alone)  Chief Complaint: Confusion  I have personally briefly reviewed patient's old medical records in [place]   HPI: Brooke Larson is a 88 y.o. female with medical history significant of hypertension, hyperlipidemia, hypothyroidism, and prior CVA who presents after being found to be confused.  She was last seen to be normal approximately 3 days ago.  History is obtained from the patient and per review of records.  Patient reports that she has some issues with her memory and has trouble with the date for which she normally looks at her phone.  She denies having any recent fevers, falls, cough, shortness of breath, abdominal pain,  nausea, vomiting, dysuria, or diarrhea symptoms.  Her daughter had noted that sometimes the patient does not take all of her medications as she should although she has a pillbox.  ED Course: Upon admission into the emergency department patient was seen to be afebrile with blood pressures elevated up to 192/89, and all other vital signs maintained.  CT scan of the head noted chronic atrophic and ischemic changes without any acute abnormality.  Labs from 1/16 noted to be around patient's baseline with BUN 18, creatinine 1.12, and total bilirubin 1.8.  MRI of the brain noted acute lacunar infarct in the posterior left entacapone with no associated hemorrhage or mass-effect, 2 punctate acute left MCA territory white matter infarcts, progression of advanced small vessel disease.  Chest x-ray noted no acute abnormality.  Influenza and COVID-19 screening were negative.  Neurology have been formally consulted.  Patient has been ordered 1 L normal saline IV fluids.   Review of Systems  Constitutional:  Negative for fever.  HENT:  Negative for ear discharge and nosebleeds.   Eyes:   Negative for photophobia and pain.  Cardiovascular:  Negative for chest pain and leg swelling.  Gastrointestinal:  Negative for abdominal pain, nausea and vomiting.  Genitourinary:  Negative for dysuria and frequency.  Skin:  Negative for rash.  Neurological:  Negative for loss of consciousness.  Psychiatric/Behavioral:  Positive for memory loss.    Past Medical History:  Diagnosis Date   Hyperlipemia    Hypertension    Thyroid disease     Past Surgical History:  Procedure Laterality Date   BALLOON ANGIOPLASTY, ARTERY  1989   CATARACT EXTRACTION Bilateral 2010     reports that she has never smoked. She does not have any smokeless tobacco history on file. She reports that she does not drink alcohol and does not use drugs.  No Known Allergies  Family History  Problem Relation Age of Onset   Breast cancer Mother    Alzheimer's disease Mother    Stroke Father     Prior to Admission medications   Medication Sig Start Date End Date Taking? Authorizing Provider  carvedilol (COREG) 12.5 MG tablet Take 12.5 mg by mouth 2 (two) times daily with a meal.   Yes [provider]  clopidogrel (PLAVIX) 75 MG tablet Take 75 mg by mouth every evening. 12/29/13  Yes [provider]  levothyroxine (SYNTHROID) 50 MCG tablet Take 50 mcg by mouth daily before breakfast.   Yes [provider]  valsartan (DIOVAN) 40 MG tablet Take 40 mg by mouth daily. 01/17/21  Yes [provider]    Physical Exam:  Constitutional: Elderly female who appears to  be in no acute distress Vitals:   05/01/21 0136 05/01/21 0259 05/01/21 0457 05/01/21 0802  BP: (!) 146/79 140/74 (!) 156/74 (!) 172/68  Pulse: 66 70 65 73  Resp: 18 16 14 17   Temp:    98.2 F (36.8 C)  TempSrc:    Oral  SpO2: 95% 97% 96% 94%   Eyes: PERRL, lids and conjunctivae normal ENMT: Mucous membranes are moist. Posterior pharynx clear of any exudate or lesions.  Neck: normal, supple, no masses, no  thyromegaly Respiratory: clear to auscultation bilaterally, no wheezing, no crackles. Normal respiratory effort. No accessory muscle use.  Cardiovascular: Regular rate and rhythm, no murmurs / rubs / gallops. No extremity edema. 2+ pedal pulses. No carotid bruits.  Abdomen: no tenderness, no masses palpated.  Bowel sounds positive.  Musculoskeletal: no clubbing / cyanosis. No joint deformity upper and lower extremities. Skin: no rashes, lesions, ulcers. No induration Neurologic: CN 2-12 grossly intact.  Speech is clear.  Strength appears to be 5/5 in the upper and lower extremities. Psychiatric: Abnormal recent memory. Alert and oriented x person and place, but has some issues with date states she usually looks at her phone.    Labs on Admission: I have personally reviewed following labs and imaging studies  CBC: Recent Labs  Lab 04/30/21 2347 05/01/21 0019  WBC 8.7  --   NEUTROABS 6.1  --   HGB 14.2 13.6  HCT 41.8 40.0  MCV 96.1  --   PLT 198  --    Basic Metabolic Panel: Recent Labs  Lab 04/30/21 2347 05/01/21 0019  NA 139 141  K 3.7 3.7  CL 105 103  CO2 27  --   GLUCOSE 117* 109*  BUN 18 21  CREATININE 1.12* 1.00  CALCIUM 9.4  --    GFR: CrCl cannot be calculated (Unknown ideal weight.). Liver Function Tests: Recent Labs  Lab 04/30/21 2347  AST 19  ALT 12  ALKPHOS 60  BILITOT 1.8*  PROT 6.6  ALBUMIN 4.0   No results for input(s): LIPASE, AMYLASE in the last 168 hours. No results for input(s): AMMONIA in the last 168 hours. Coagulation Profile: Recent Labs  Lab 04/30/21 2347  INR 1.0   Cardiac Enzymes: No results for input(s): CKTOTAL, CKMB, CKMBINDEX, TROPONINI in the last 168 hours. BNP (last 3 results) No results for input(s): PROBNP in the last 8760 hours. HbA1C: No results for input(s): HGBA1C in the last 72 hours. CBG: No results for input(s): GLUCAP in the last 168 hours. Lipid Profile: No results for input(s): CHOL, HDL, LDLCALC, TRIG,  CHOLHDL, LDLDIRECT in the last 72 hours. Thyroid Function Tests: No results for input(s): TSH, T4TOTAL, FREET4, T3FREE, THYROIDAB in the last 72 hours. Anemia Panel: No results for input(s): VITAMINB12, FOLATE, FERRITIN, TIBC, IRON, RETICCTPCT in the last 72 hours. Urine analysis:    Component Value Date/Time   COLORURINE STRAW (A) 05/13/2017 1915   APPEARANCEUR CLEAR 05/13/2017 1915   LABSPEC 1.005 05/13/2017 1915   PHURINE 6.0 05/13/2017 1915   GLUCOSEU NEGATIVE 05/13/2017 [place] NEGATIVE 05/13/2017 1915   BILIRUBINUR NEGATIVE 05/13/2017 [place] 05/13/2017 1915   PROTEINUR NEGATIVE 05/13/2017 1915   NITRITE NEGATIVE 05/13/2017 1915   LEUKOCYTESUR MODERATE (A) 05/13/2017 1915   Sepsis Labs: Recent Results (from the past 240 hour(s))  Resp Panel by RT-PCR (Flu A&B, Covid) Nasopharyngeal Swab     Status: None   Collection Time: 04/30/21 11:36 PM   Specimen: Nasopharyngeal Swab; Nasopharyngeal(NP) swabs  in vial transport medium  Result Value Ref Range Status   SARS Coronavirus 2 by RT PCR NEGATIVE NEGATIVE Final    Comment: (NOTE) SARS-CoV-2 target nucleic acids are NOT DETECTED.  The SARS-CoV-2 RNA is generally detectable in upper respiratory specimens during the acute phase of infection. The lowest concentration of SARS-CoV-2 viral copies this assay can detect is 138 copies/mL. A negative result does not preclude SARS-Cov-2 infection and should not be used as the sole basis for treatment or other patient management decisions. A negative result may occur with  improper specimen collection/handling, submission of specimen other than nasopharyngeal swab, presence of viral mutation(s) within the areas targeted by this assay, and inadequate number of viral copies(<138 copies/mL). A negative result must be combined with clinical observations, patient history, and epidemiological information. The expected result is Negative.  Fact Sheet for Patients:   EntrepreneurPulse.com.au  Fact Sheet for Healthcare Providers:  IncredibleEmployment.be  This test is no t yet approved or cleared by the [place] FDA and  has been authorized for detection and/or diagnosis of SARS-CoV-2 by FDA under an Emergency Use Authorization (EUA). This EUA will remain  in effect (meaning this test can be used) for the duration of the COVID-19 declaration under Section 564(b)(1) of the Act, 21 U.S.C.section 360bbb-3(b)(1), unless the authorization is terminated  or revoked sooner.       Influenza A by PCR NEGATIVE NEGATIVE Final   Influenza B by PCR NEGATIVE NEGATIVE Final    Comment: (NOTE) The Xpert Xpress SARS-CoV-2/FLU/RSV plus assay is intended as an aid in the diagnosis of influenza from Nasopharyngeal swab specimens and should not be used as a sole basis for treatment. Nasal washings and aspirates are unacceptable for Xpert Xpress SARS-CoV-2/FLU/RSV testing.  Fact Sheet for Patients: EntrepreneurPulse.com.au  Fact Sheet for Healthcare Providers: IncredibleEmployment.be  This test is not yet approved or cleared by the [place] FDA and has been authorized for detection and/or diagnosis of SARS-CoV-2 by FDA under an Emergency Use Authorization (EUA). This EUA will remain in effect (meaning this test can be used) for the duration of the COVID-19 declaration under Section 564(b)(1) of the Act, 21 U.S.C. section 360bbb-3(b)(1), unless the authorization is terminated or revoked.  Performed at [facility], [place] [address] [place], [place] [address]      Radiological Exams on Admission: CT Head Wo Contrast  Result Date: 05/01/2021 CLINICAL DATA:  Delirium EXAM: CT HEAD WITHOUT CONTRAST TECHNIQUE: Contiguous axial images were obtained from the base of the skull through the vertex without intravenous contrast. RADIATION DOSE REDUCTION: This exam was performed  according to the departmental dose-optimization program which includes automated exposure control, adjustment of the mA and/or kV according to patient size and/or use of iterative reconstruction technique. COMPARISON:  06/05/2019 FINDINGS: Brain: No evidence of acute infarction, hemorrhage, hydrocephalus, extra-axial collection or mass lesion/mass effect. Mild atrophic changes and chronic white matter ischemic change is seen. Vascular: No hyperdense vessel or unexpected calcification. Skull: Normal. Negative for fracture or focal lesion. Sinuses/Orbits: No acute finding. Other: None. IMPRESSION: Chronic atrophic and ischemic changes without acute abnormality. Electronically Signed   By: Inez Catalina M.D.   On: 05/01/2021 00:11   MR BRAIN WO CONTRAST  Result Date: 05/01/2021 CLINICAL DATA:  88 year old female with delirium, altered mental status, found down. Neurologic deficit. EXAM: MRI HEAD WITHOUT CONTRAST TECHNIQUE: Multiplanar, multiecho pulse sequences of the brain and surrounding structures were obtained without intravenous contrast. COMPARISON:  Head CT 0003 hours today.  Brain  MRI 10/10/2014. FINDINGS: Brain: Linear roughly 13 mm area of restricted diffusion in the posterior left lentiform and/or posterior deep white matter capsules (series 5, image 80). And there are 2 additional punctate foci of left MCA territory white matter diffusion restriction (series 5, image 88). Associated T2 and FLAIR hyperintensity with no acute hemorrhage or mass effect. Chronic right MCA territory cortical encephalomalacia involving the right middle frontal gyrus is new since 2016. And widespread bilateral cerebral white matter T2 and FLAIR hyperintensity has progressed since that time. Similar chronic but increased T2 and FLAIR heterogeneity throughout the bilateral deep gray matter nuclei likely a combination of perivascular spaces and chronic lacunar infarcts (definite right thalamic chronic lacune on series 10, image  14). Similar heterogeneity in the pons appears increased. And several chronic microhemorrhages in the pons are new since 2016. No other chronic cerebral blood products or cortical encephalomalacia identified. No midline shift, mass effect, evidence of mass lesion, ventriculomegaly, extra-axial collection or acute intracranial hemorrhage. Cervicomedullary junction and pituitary are within normal limits. Vascular: Major intracranial vascular flow voids are preserved. There is mild to moderate generalized intracranial artery tortuosity. Skull and upper cervical spine: Negative for age visible cervical spine. Normal bone marrow signal. Sinuses/Orbits: Negative, postoperative changes to both globes. Other: Mastoids are well aerated. Grossly normal visible internal auditory structures. Negative visible scalp and face. IMPRESSION: Acute on chronic small vessel disease: - acute lacunar infarct in the posterior left lentiform with No associated hemorrhage or mass effect. - and 2 punctate acute left MCA territory white matter infarcts also. - progressed underlying advanced small vessel disease, and right MCA territory ischemia since 2016. Chronic microhemorrhages in the pons. Electronically Signed   By: Genevie Ann M.D.   On: 05/01/2021 06:55   DG Chest Port 1 View  Result Date: 05/01/2021 CLINICAL DATA:  Weakness.  Altered mental status and confusion. EXAM: PORTABLE CHEST 1 VIEW COMPARISON:  05/13/2017 FINDINGS: Normal heart size. No pleural effusion or edema. No airspace opacities identified. The visualized osseous structures are unremarkable. IMPRESSION: No acute cardiopulmonary abnormalities. Electronically Signed   By: Kerby Moors M.D.   On: 05/01/2021 11:05    EKG: Independently reviewed.  Atrial fibrillation at 75 bpm  Assessment/Plan OVF:IEPPI.  MRI significant for acute on chronic small vessel disease with acute lacunar infarcts of the posterior left continue perform and 2 punctate acute infarcts of the left  MCA territory white matter.  Neurology evaluated patient for the stroke which were thought to be likely ischemic in nature although new atrial fibrillation was appreciated. -Admit to telemetry bed -Stroke order set initiated -Neuro checks -MRA of the head and neck -Check Hemoglobin A1c and lipid panel in a.m. -Check echocardiogram -Follow-up vitamin B12 and folate levels -PT/OT/Speech to eval and treat -ASA and Plavix have been initially ordered, but were discontinued due to atrial fibrillation and plans to start anticoagulation -Transitions of care consulted  -[place] neurology consultative services, will follow-up further recommendation  New onset atrial fibrillation: Acute.  Patient noted to be in atrial fibrillation at 75 bpm and appears to be relatively rate controlled at this time.  CHA2DS2-VASc score = to at least 6 based off age, sex, HTN, and stroke. -Goal potassium at least 4 and magnesium at least 2.  Replace electrolytes as needed to goal -Heparin per pharmacy -Transition to oral anticoagulation in a.m.   Urinary tract infection: Acute.  Patient denied any complaints of dysuria.  However noted to have moderate leukocytes with many bacteria present, and greater  than 50 WBCs on urinalysis. -Check urine culture -Rocephin IV  Acute metabolic encephalopathy:  Patient presented after being noted to be more confused.  Suspect secondary to urinary tract infection. -Follow-up UDS, TSH, ammonia level  Essential hypertension: Blood pressures have been from 140/74-192/89.  Home medication regimen includes Coreg 12.5 mg twice daily and valsartan 40 mg daily. -Allow for permissive hypertension <220/110 for first 24 hours -Holding home blood pressure regimen  Hyperlipidemia: LDL 161 -Goal LDL less than 70 -Start Atorvastatin 80 mg  Hypothyroidism -Follow-up TSH -Continue levothyroxine.  Adjust dose if needed  DVT prophylaxis: Heparin Code Status: Full Family Communication:  None Disposition Plan: To be determined Consults called: Neurology Admission status: Inpatient, require more than 2 midnight stay in the setting of acute stroke  Norval Morton MD [facility]   If 7PM-7AM, please contact night-coverage   05/01/2021, 11:10 AM

## 2021-05-02 ENCOUNTER — Other Ambulatory Visit (HOSPITAL_COMMUNITY): Payer: Self-pay

## 2021-05-02 LAB — CBC
HCT: 36.8 % (ref 36.0–46.0)
Hemoglobin: 13.1 g/dL (ref 12.0–15.0)
MCH: 33.3 pg (ref 26.0–34.0)
MCHC: 35.6 g/dL (ref 30.0–36.0)
MCV: 93.6 fL (ref 80.0–100.0)
Platelets: 174 10*3/uL (ref 150–400)
RBC: 3.93 MIL/uL (ref 3.87–5.11)
RDW: 11.9 % (ref 11.5–15.5)
WBC: 5.5 10*3/uL (ref 4.0–10.5)
nRBC: 0 % (ref 0.0–0.2)

## 2021-05-02 LAB — VITAMIN D 25 HYDROXY (VIT D DEFICIENCY, FRACTURES): Vit D, 25-Hydroxy: 29.93 ng/mL — ABNORMAL LOW (ref 30–100)

## 2021-05-02 LAB — VITAMIN B1: Vitamin B1 (Thiamine): 126.2 nmol/L (ref 66.5–200.0)

## 2021-05-02 LAB — HEPARIN LEVEL (UNFRACTIONATED): Heparin Unfractionated: 0.74 IU/mL — ABNORMAL HIGH (ref 0.30–0.70)

## 2021-05-02 MED ORDER — ASPIRIN EC 81 MG PO TBEC
81.0000 mg | DELAYED_RELEASE_TABLET | Freq: Every day | ORAL | 1 refills | Status: AC
Start: 2021-05-02 — End: ?

## 2021-05-02 MED ORDER — FOSFOMYCIN TROMETHAMINE 3 G PO PACK
3.0000 g | PACK | Freq: Once | ORAL | Status: DC
  Filled 2021-05-02 (×2): qty 3

## 2021-05-02 MED ORDER — ATORVASTATIN CALCIUM 80 MG PO TABS: 80.0000 mg | ORAL_TABLET | Freq: Every day | ORAL | 1 refills | Status: AC

## 2021-05-02 MED ORDER — VITAMIN B-12 1000 MCG PO TABS: 1000.0000 ug | ORAL_TABLET | Freq: Every day | ORAL | 1 refills | Status: AC

## 2021-05-02 MED ORDER — CLOPIDOGREL BISULFATE 75 MG PO TABS: 75.0000 mg | ORAL_TABLET | Freq: Every evening | ORAL | 0 refills | Status: AC

## 2021-05-02 MED ORDER — APIXABAN 2.5 MG PO TABS
2.5000 mg | ORAL_TABLET | Freq: Two times a day (BID) | ORAL | Status: DC
  Administered 2021-05-02: 2.5 mg via ORAL
  Filled 2021-05-02: qty 1

## 2021-05-02 NOTE — Discharge Summary (Signed)
Physician Discharge Summary  Brooke Larson FKC:[id number] DOB: April 01, 1932 DOA: 04/30/2021  PCP: Carol Ada, MD  Admit date: 04/30/2021 Discharge date: 05/02/2021  Admitted From: Home Disposition: Home   Recommendations for Outpatient Follow-up:  Follow up with PCP in 1-2 weeks Follow up with neurology for stroke follow up.  Home Health: None Equipment/Devices: None Discharge Condition: Stable CODE STATUS: Full Diet recommendation: Heart healthy  Brief/Interim Summary: Per HPI at admission:   Brooke Larson is a 88 y.o. female with medical history significant of hypertension, hyperlipidemia, hypothyroidism, and prior CVA who presents after being found to be confused.  She was last seen to be normal approximately 3 days ago.  History is obtained from the patient and per review of records.  Patient reports that she has some issues with her memory and has trouble with the date for which she normally looks at her phone.  She denies having any recent fevers, falls, cough, shortness of breath, abdominal pain,  nausea, vomiting, dysuria, or diarrhea symptoms.  Her daughter had noted that sometimes the patient does not take all of her medications as she should although she has a pillbox.   ED Course: Upon admission into the emergency department patient was seen to be afebrile with blood pressures elevated up to 192/89, and all other vital signs maintained.  CT scan of the head noted chronic atrophic and ischemic changes without any acute abnormality.  Labs from 1/16 noted to be around patient's baseline with BUN 18, creatinine 1.12, and total bilirubin 1.8.  MRI of the brain noted acute lacunar infarct in the posterior left entacapone with no associated hemorrhage or mass-effect, 2 punctate acute left MCA territory white matter infarcts, progression of advanced small vessel disease.  Chest x-ray noted no acute abnormality.  Influenza and COVID-19 screening were negative.  Neurology have been  formally consulted.  Patient has been ordered 1 L normal saline IV fluids.  Discharge Diagnoses:  Principal Problem:   CVA (cerebral vascular accident) ([place]) Active Problems:   Acute metabolic encephalopathy   Urinary tract infection   New onset atrial fibrillation: THIS HAS BEEN RULED OUT   Hyperlipidemia   Hypothyroidism  CVA: Acute.  MRI significant for acute on chronic small vessel disease with acute lacunar infarcts of the posterior left continue perform and 2 punctate acute infarcts of the left MCA territory white matter.   - DAPT x3 weeks, then aspirin alone, started statin, f/u with neurology in 6-8 weeks.    Suspicion for atrial fibrillation: This is not felt to be an accurate diagnosis in retrospect, but ECG and telemetry reveal NSR with PACs. This was reviewed by myself and by cardiology, Dr. Gwenlyn Found. No further work up recommended currently, could consider cardiac monitoring after discharge.    Urinary tract infection:  - Completed ceftriaxone with significant improvement, given fosfomycin prior to discharge to complete course.   Acute metabolic encephalopathy:  Patient presented after being noted to be more confused.  Suspect secondary to urinary tract infection. Has resolved with antibiotics.    Essential hypertension: Can restart home med.   Hyperlipidemia: LDL 161 - Start statin   Hypothyroidism: TSH wnl.  - Continue synthroid  Discharge Instructions Discharge Instructions     Ambulatory referral to Neurology   Complete by: As directed    An appointment is requested in approximately: 8 weeks   Diet - low sodium heart healthy   Complete by: As directed    Discharge instructions   Complete by: As directed  You were admitted for confusion that has improved with suspicion that a UTI was the primary cause. This has been treated with antibiotics during your admission and will not require continued antibiotics after discharge. There were also strokes noted on your  brain imaging. Neurology has recommended that you:  - Continue plavix 75mg  once daily for 3 more weeks, then stop.  - Start aspirin 81mg  daily, continue indefinitely.  - Start lipitor 80mg  once daily - There was initial concern for atrial fibrillation, an abnormal heart rhythm, which does not appear to be the case. You do not need further testing or any ongoing treatment for that condition. If you experience chest pain, trouble breathing, or palpitations, seek medication attention.  - Otherwise, follow up with your PCP in the next 2 weeks and follow up with neurology in the next 6-8 weeks.   Increase activity slowly   Complete by: As directed       Allergies as of 05/02/2021   No Known Allergies      Medication List     TAKE these medications    aspirin EC 81 MG tablet Take 1 tablet (81 mg total) by mouth daily. Indefinitely   atorvastatin 80 MG tablet Commonly known as: LIPITOR Take 1 tablet (80 mg total) by mouth daily. Start taking on: May 03, 2021   carvedilol 12.5 MG tablet Commonly known as: COREG Take 12.5 mg by mouth 2 (two) times daily with a meal.   clopidogrel 75 MG tablet Commonly known as: PLAVIX Take 1 tablet (75 mg total) by mouth every evening for 21 days. then stop What changed: additional instructions   levothyroxine 50 MCG tablet Commonly known as: SYNTHROID Take 50 mcg by mouth daily before breakfast.   valsartan 40 MG tablet Commonly known as: DIOVAN Take 40 mg by mouth daily.        Follow-up Information     Carol Ada, MD Follow up.   Specialty: Family Medicine Contact information: [address] [place] [address] [phone]         Garvin Fila, MD Follow up.   Specialties: Neurology, Radiology Contact information: [address] [place] [id number] [phone]                No Known Allergies  Consultations: Neurology Cardiology  Procedures/Studies: CT Head Wo  Contrast  Result Date: 05/01/2021 CLINICAL DATA:  Delirium EXAM: CT HEAD WITHOUT CONTRAST TECHNIQUE: Contiguous axial images were obtained from the base of the skull through the vertex without intravenous contrast. RADIATION DOSE REDUCTION: This exam was performed according to the departmental dose-optimization program which includes automated exposure control, adjustment of the mA and/or kV according to patient size and/or use of iterative reconstruction technique. COMPARISON:  06/05/2019 FINDINGS: Brain: No evidence of acute infarction, hemorrhage, hydrocephalus, extra-axial collection or mass lesion/mass effect. Mild atrophic changes and chronic white matter ischemic change is seen. Vascular: No hyperdense vessel or unexpected calcification. Skull: Normal. Negative for fracture or focal lesion. Sinuses/Orbits: No acute finding. Other: None. IMPRESSION: Chronic atrophic and ischemic changes without acute abnormality. Electronically Signed   By: Inez Catalina M.D.   On: 05/01/2021 00:11   MR ANGIO HEAD WO CONTRAST  Result Date: 05/02/2021 CLINICAL DATA:  Follow-up examination for stroke. EXAM: MRA NECK WITHOUT CONTRAST MRA HEAD WITHOUT CONTRAST TECHNIQUE: Angiographic images of the Circle of Willis were acquired using MRA technique without intravenous contrast. COMPARISON:  Comparison made with prior MRI from 05/01/2021. FINDINGS: MRA NECK  FINDINGS AORTIC ARCH: Examination severely degraded by motion artifact. Visualized aortic arch grossly within normal limits for caliber with normal branch pattern. No visible stenosis or other abnormality about the origin of the great vessels. RIGHT CAROTID SYSTEM: Visualized right CCA patent without stenosis. Mild atheromatous irregularity about the right carotid bulb/proximal right ICA without hemodynamically significant stenosis. Visualized right ICA otherwise grossly patent without stenosis or evidence for dissection. LEFT CAROTID SYSTEM: Visualized left CCA patent  without stenosis. Mild atheromatous irregularity about the left carotid bulb/proximal left ICA without hemodynamically significant stenosis. Visualized left ICA otherwise patent without stenosis or evidence for dissection. VERTEBRAL ARTERIES: Left vertebral artery likely arises directly from the aortic arch. Proximal aspects of both vertebral arteries poorly assessed on this exam due to motion. Visualized portions of the vertebral arteries grossly patent without visible stenosis or evidence for dissection. MRA HEAD FINDINGS ANTERIOR CIRCULATION: Examination moderately degraded by motion artifact. Visualized distal cervical segments of the internal carotid arteries are patent with antegrade flow. Petrous segments patent bilaterally. Probable mild atheromatous irregularity within the carotid siphons without hemodynamically significant stenosis. A1 segments patent bilaterally. Grossly normal anterior communicating artery complex. Possible short-segment moderate stenosis involving the proximal left A2 segment (series 1013, image 10). ACAs otherwise grossly patent to their distal aspects without significant stenosis. Right M1 segment patent. Grossly normal right MCA bifurcation. Probable focal severe proximal right M2 stenosis (series 5, image 93). Distal right MCA branches appear grossly perfused on time-of-flight sequence, although are attenuated as compared to the contralateral left MCA branches. Finding in keeping with the chronic right MCA distribution infarcts seen on prior MRI. Atheromatous irregularity with mild to moderate diffuse narrowing of the mid-distal left M1 segment. Normal left MCA bifurcation. Distal left MCA branches remain patent and perfused. POSTERIOR CIRCULATION: Visualized distal V4 segments patent without stenosis. Both PICA grossly patent at their origins. Basilar patent to its distal aspect without stenosis. Superior cerebellar arteries patent bilaterally. Both PCAs primarily supplied via the  basilar. Short-segment moderate to severe proximal left P2 stenosis (series 1027, image 7). PCAs irregular but otherwise grossly patent to their distal aspects bilaterally. No visible intracranial aneurysm. IMPRESSION: MRA HEAD IMPRESSION: 1. Motion degraded exam. 2. Negative intracranial MRA for large vessel occlusion. 3. Severe proximal right M2 stenosis. Distal right MCA branches are grossly patent but attenuated as compared to the left. Finding is consistent with the chronic right MCA territory infarct as seen on prior MRI. 4. Additional intracranial atherosclerotic disease as above. Notable findings include a moderate to severe proximal left P2 stenosis, with possible short-segment moderate left A2 stenosis. MRA NECK IMPRESSION: 1. Severely motion degraded exam. 2. Mild for age atheromatous change about the carotid bifurcations without significant stenosis. Both carotid artery systems patent without hemodynamically significant stenosis. 3. Wide patency of both vertebral arteries within the neck. Electronically Signed   By: Jeannine Boga M.D.   On: 05/02/2021 05:37   MR ANGIO NECK WO CONTRAST  Result Date: 05/02/2021 CLINICAL DATA:  Follow-up examination for stroke. EXAM: MRA NECK WITHOUT CONTRAST MRA HEAD WITHOUT CONTRAST TECHNIQUE: Angiographic images of the Circle of Willis were acquired using MRA technique without intravenous contrast. COMPARISON:  Comparison made with prior MRI from 05/01/2021. FINDINGS: MRA NECK FINDINGS AORTIC ARCH: Examination severely degraded by motion artifact. Visualized aortic arch grossly within normal limits for caliber with normal branch pattern. No visible stenosis or other abnormality about the origin of the great vessels. RIGHT CAROTID SYSTEM: Visualized right CCA patent without stenosis. Mild atheromatous irregularity about  the right carotid bulb/proximal right ICA without hemodynamically significant stenosis. Visualized right ICA otherwise grossly patent without  stenosis or evidence for dissection. LEFT CAROTID SYSTEM: Visualized left CCA patent without stenosis. Mild atheromatous irregularity about the left carotid bulb/proximal left ICA without hemodynamically significant stenosis. Visualized left ICA otherwise patent without stenosis or evidence for dissection. VERTEBRAL ARTERIES: Left vertebral artery likely arises directly from the aortic arch. Proximal aspects of both vertebral arteries poorly assessed on this exam due to motion. Visualized portions of the vertebral arteries grossly patent without visible stenosis or evidence for dissection. MRA HEAD FINDINGS ANTERIOR CIRCULATION: Examination moderately degraded by motion artifact. Visualized distal cervical segments of the internal carotid arteries are patent with antegrade flow. Petrous segments patent bilaterally. Probable mild atheromatous irregularity within the carotid siphons without hemodynamically significant stenosis. A1 segments patent bilaterally. Grossly normal anterior communicating artery complex. Possible short-segment moderate stenosis involving the proximal left A2 segment (series 1013, image 10). ACAs otherwise grossly patent to their distal aspects without significant stenosis. Right M1 segment patent. Grossly normal right MCA bifurcation. Probable focal severe proximal right M2 stenosis (series 5, image 93). Distal right MCA branches appear grossly perfused on time-of-flight sequence, although are attenuated as compared to the contralateral left MCA branches. Finding in keeping with the chronic right MCA distribution infarcts seen on prior MRI. Atheromatous irregularity with mild to moderate diffuse narrowing of the mid-distal left M1 segment. Normal left MCA bifurcation. Distal left MCA branches remain patent and perfused. POSTERIOR CIRCULATION: Visualized distal V4 segments patent without stenosis. Both PICA grossly patent at their origins. Basilar patent to its distal aspect without stenosis.  Superior cerebellar arteries patent bilaterally. Both PCAs primarily supplied via the basilar. Short-segment moderate to severe proximal left P2 stenosis (series 1027, image 7). PCAs irregular but otherwise grossly patent to their distal aspects bilaterally. No visible intracranial aneurysm. IMPRESSION: MRA HEAD IMPRESSION: 1. Motion degraded exam. 2. Negative intracranial MRA for large vessel occlusion. 3. Severe proximal right M2 stenosis. Distal right MCA branches are grossly patent but attenuated as compared to the left. Finding is consistent with the chronic right MCA territory infarct as seen on prior MRI. 4. Additional intracranial atherosclerotic disease as above. Notable findings include a moderate to severe proximal left P2 stenosis, with possible short-segment moderate left A2 stenosis. MRA NECK IMPRESSION: 1. Severely motion degraded exam. 2. Mild for age atheromatous change about the carotid bifurcations without significant stenosis. Both carotid artery systems patent without hemodynamically significant stenosis. 3. Wide patency of both vertebral arteries within the neck. Electronically Signed   By: Jeannine Boga M.D.   On: 05/02/2021 05:37   MR BRAIN WO CONTRAST  Result Date: 05/01/2021 CLINICAL DATA:  88 year old female with delirium, altered mental status, found down. Neurologic deficit. EXAM: MRI HEAD WITHOUT CONTRAST TECHNIQUE: Multiplanar, multiecho pulse sequences of the brain and surrounding structures were obtained without intravenous contrast. COMPARISON:  Head CT 0003 hours today.  Brain MRI 10/10/2014. FINDINGS: Brain: Linear roughly 13 mm area of restricted diffusion in the posterior left lentiform and/or posterior deep white matter capsules (series 5, image 80). And there are 2 additional punctate foci of left MCA territory white matter diffusion restriction (series 5, image 88). Associated T2 and FLAIR hyperintensity with no acute hemorrhage or mass effect. Chronic right MCA  territory cortical encephalomalacia involving the right middle frontal gyrus is new since 2016. And widespread bilateral cerebral white matter T2 and FLAIR hyperintensity has progressed since that time. Similar chronic but increased T2 and FLAIR  heterogeneity throughout the bilateral deep gray matter nuclei likely a combination of perivascular spaces and chronic lacunar infarcts (definite right thalamic chronic lacune on series 10, image 14). Similar heterogeneity in the pons appears increased. And several chronic microhemorrhages in the pons are new since 2016. No other chronic cerebral blood products or cortical encephalomalacia identified. No midline shift, mass effect, evidence of mass lesion, ventriculomegaly, extra-axial collection or acute intracranial hemorrhage. Cervicomedullary junction and pituitary are within normal limits. Vascular: Major intracranial vascular flow voids are preserved. There is mild to moderate generalized intracranial artery tortuosity. Skull and upper cervical spine: Negative for age visible cervical spine. Normal bone marrow signal. Sinuses/Orbits: Negative, postoperative changes to both globes. Other: Mastoids are well aerated. Grossly normal visible internal auditory structures. Negative visible scalp and face. IMPRESSION: Acute on chronic small vessel disease: - acute lacunar infarct in the posterior left lentiform with No associated hemorrhage or mass effect. - and 2 punctate acute left MCA territory white matter infarcts also. - progressed underlying advanced small vessel disease, and right MCA territory ischemia since 2016. Chronic microhemorrhages in the pons. Electronically Signed   By: Genevie Ann M.D.   On: 05/01/2021 06:55   DG Chest Port 1 View  Result Date: 05/01/2021 CLINICAL DATA:  Weakness.  Altered mental status and confusion. EXAM: PORTABLE CHEST 1 VIEW COMPARISON:  05/13/2017 FINDINGS: Normal heart size. No pleural effusion or edema. No airspace opacities  identified. The visualized osseous structures are unremarkable. IMPRESSION: No acute cardiopulmonary abnormalities. Electronically Signed   By: Kerby Moors M.D.   On: 05/01/2021 11:05   ECHOCARDIOGRAM COMPLETE  Result Date: 05/01/2021    ECHOCARDIOGRAM REPORT   Patient Name:   Brooke Larson Date of Exam: 05/01/2021 Medical Rec #:  [id number]      Height:       61.0 in Accession #:    [id number]     Weight:       132.3 lb Date of Birth:  06-Nov-1931     BSA:          1.584 m Patient Age:    88 years       BP:           140/74 mmHg Patient Gender: F              HR:           61 bpm. Exam Location:  Inpatient Procedure: 2D Echo, Cardiac Doppler and Color Doppler Indications:    Stroke  History:        Patient has no prior history of Echocardiogram examinations.  Sonographer:    Jyl Heinz Referring Phys: [id number] RONDELL A SMITH IMPRESSIONS  1. Left ventricular ejection fraction, by estimation, is 60 to 65%. The left ventricle has normal function. The left ventricle has no regional wall motion abnormalities. Left ventricular diastolic function could not be evaluated.  2. Right ventricular systolic function is normal. The right ventricular size is normal. There is normal pulmonary artery systolic pressure. The estimated right ventricular systolic pressure is 87.8 mmHg.  3. The mitral valve is degenerative. Mild mitral valve regurgitation. Mild mitral stenosis. The mean mitral valve gradient is 4.0 mmHg with average heart rate of 68 bpm.  4. The aortic valve is tricuspid. There is mild calcification of the aortic valve. There is mild thickening of the aortic valve. Aortic valve regurgitation is trivial. Mild aortic valve stenosis.  5. The inferior vena cava is normal in size with greater than 50% respiratory  variability, suggesting right atrial pressure of 3 mmHg. Conclusion(s)/Recommendation(s): No intracardiac source of embolism detected on this transthoracic study. Consider a transesophageal echocardiogram to  exclude cardiac source of embolism if clinically indicated. FINDINGS  Left Ventricle: Left ventricular ejection fraction, by estimation, is 60 to 65%. The left ventricle has normal function. The left ventricle has no regional wall motion abnormalities. The left ventricular internal cavity size was normal in size. There is  no left ventricular hypertrophy. Left ventricular diastolic function could not be evaluated due to mitral annular calcification (moderate or greater). Left ventricular diastolic function could not be evaluated. Right Ventricle: The right ventricular size is normal. No increase in right ventricular wall thickness. Right ventricular systolic function is normal. There is normal pulmonary artery systolic pressure. The tricuspid regurgitant velocity is 2.47 m/s, and  with an assumed right atrial pressure of 3 mmHg, the estimated right ventricular systolic pressure is 40.3 mmHg. Left Atrium: Left atrial size was normal in size. Right Atrium: Right atrial size was normal in size. Pericardium: There is no evidence of pericardial effusion. Mitral Valve: The mitral valve is degenerative in appearance. Mild mitral valve regurgitation. Mild mitral valve stenosis. MV peak gradient, 6.6 mmHg. The mean mitral valve gradient is 4.0 mmHg with average heart rate of 68 bpm. Tricuspid Valve: The tricuspid valve is grossly normal. Tricuspid valve regurgitation is mild . No evidence of tricuspid stenosis. Aortic Valve: The aortic valve is tricuspid. There is mild calcification of the aortic valve. There is mild thickening of the aortic valve. Aortic valve regurgitation is trivial. Mild aortic stenosis is present. Aortic valve mean gradient measures 17.0 mmHg. Aortic valve peak gradient measures 25.2 mmHg. Aortic valve area, by VTI measures 1.45 cm. Pulmonic Valve: The pulmonic valve was grossly normal. Pulmonic valve regurgitation is not visualized. No evidence of pulmonic stenosis. Aorta: The aortic root and  ascending aorta are structurally normal, with no evidence of dilitation. Venous: The inferior vena cava is normal in size with greater than 50% respiratory variability, suggesting right atrial pressure of 3 mmHg. IAS/Shunts: The atrial septum is grossly normal.  LEFT VENTRICLE PLAX 2D LVIDd:         4.20 cm     Diastology LVIDs:         2.90 cm     LV e' medial:    4.82 cm/s LV PW:         0.90 cm     LV E/e' medial:  18.7 LV IVS:        0.90 cm     LV e' lateral:   6.85 cm/s LVOT diam:     1.80 cm     LV E/e' lateral: 13.1 LV SV:         71 LV SV Index:   45 LVOT Area:     2.54 cm  LV Volumes (MOD) LV vol d, MOD A2C: 65.7 ml LV vol d, MOD A4C: 69.5 ml LV vol s, MOD A2C: 23.0 ml LV vol s, MOD A4C: 26.3 ml LV SV MOD A2C:     42.7 ml LV SV MOD A4C:     69.5 ml LV SV MOD BP:      42.5 ml RIGHT VENTRICLE            IVC RV Basal diam:  2.50 cm    IVC diam: 1.40 cm RV Mid diam:    2.60 cm RV S prime:     6.48 cm/s TAPSE (M-mode): 1.6 cm LEFT ATRIUM  Index        RIGHT ATRIUM           Index LA diam:        3.70 cm 2.34 cm/m   RA Area:     10.20 cm LA Vol (A2C):   26.1 ml 16.47 ml/m  RA Volume:   21.40 ml  13.51 ml/m LA Vol (A4C):   32.0 ml 20.20 ml/m LA Biplane Vol: 29.4 ml 18.56 ml/m  AORTIC VALVE AV Area (Vmax):    1.32 cm AV Area (Vmean):   1.39 cm AV Area (VTI):     1.45 cm AV Vmax:           251.00 cm/s AV Vmean:          185.000 cm/s AV VTI:            0.492 m AV Peak Grad:      25.2 mmHg AV Mean Grad:      17.0 mmHg LVOT Vmax:         130.50 cm/s LVOT Vmean:        101.200 cm/s LVOT VTI:          0.279 m LVOT/AV VTI ratio: 0.57  AORTA Ao Root diam: 2.50 cm Ao Asc diam:  3.70 cm MITRAL VALVE                TRICUSPID VALVE MV Area (PHT): 2.32 cm     TR Peak grad:   24.4 mmHg MV Area VTI:   1.78 cm     TR Vmax:        247.00 cm/s MV Peak grad:  6.6 mmHg MV Mean grad:  4.0 mmHg     SHUNTS MV Vmax:       1.28 m/s     Systemic VTI:  0.28 m MV Vmean:      96.4 cm/s    Systemic Diam: 1.80 cm MV Decel  Time: 327 msec MV E velocity: 90.00 cm/s MV A velocity: 120.00 cm/s MV E/A ratio:  0.75 Eleonore Chiquito MD Electronically signed by Eleonore Chiquito MD Signature Date/Time: 05/01/2021/2:34:29 PM    Final      Subjective: Feels well, wants to go home.   Discharge Exam: Vitals:   05/02/21 0833 05/02/21 1059  BP: 137/70 (!) 105/59  Pulse: 69 65  Resp: 20 16  Temp: 98.1 F (36.7 C) 98.2 F (36.8 C)  SpO2: 96% 97%   General: Pt is alert, awake, not in acute distress Cardiovascular: RRR, S1/S2 +, no rubs, no gallops Respiratory: CTA bilaterally, no wheezing, no rhonchi Abdominal: Soft, NT, ND, bowel sounds + Extremities: No edema, no cyanosis  Labs: BNP (last 3 results) No results for input(s): BNP in the last 8760 hours. Basic Metabolic Panel: Recent Labs  Lab 04/30/21 2347 05/01/21 0019  NA 139 [id number]  K 3.7 3.7  CL 105 103  CO2 27  --   GLUCOSE 117* 109*  BUN 18 21  CREATININE 1.12* 1.00  CALCIUM 9.4  --    Liver Function Tests: Recent Labs  Lab 04/30/21 2347  AST 19  ALT 12  ALKPHOS 60  BILITOT 1.8*  PROT 6.6  ALBUMIN 4.0   No results for input(s): LIPASE, AMYLASE in the last 168 hours. Recent Labs  Lab 05/01/21 1130  AMMONIA 17   CBC: Recent Labs  Lab 04/30/21 2347 05/01/21 0019 05/02/21 0154  WBC 8.7  --  5.5  NEUTROABS 6.1  --   --  HGB 14.2 13.6 13.1  HCT 41.8 40.0 36.8  MCV 96.1  --  93.6  PLT 198  --  174   Cardiac Enzymes: No results for input(s): CKTOTAL, CKMB, CKMBINDEX, TROPONINI in the last 168 hours. BNP: Invalid input(s): POCBNP CBG: No results for input(s): GLUCAP in the last 168 hours. D-Dimer No results for input(s): DDIMER in the last 72 hours. Hgb A1c Recent Labs    05/01/21 1138  HGBA1C 5.1   Lipid Profile Recent Labs    05/01/21 1130  CHOL 237*  HDL 52  LDLCALC 161*  TRIG 120  CHOLHDL 4.6   Thyroid function studies Recent Labs    05/01/21 1130  TSH 2.239   Anemia work up Recent Labs    05/01/21 1130   VITAMINB12 259  FOLATE 22.2   Urinalysis    Component Value Date/Time   COLORURINE YELLOW 05/01/2021 [place] 05/01/2021 1217   LABSPEC 1.020 05/01/2021 1217   PHURINE 6.0 05/01/2021 1217   GLUCOSEU NEGATIVE 05/01/2021 1217   HGBUR SMALL (A) 05/01/2021 1217   BILIRUBINUR NEGATIVE 05/01/2021 1217   [place] 05/01/2021 1217   PROTEINUR NEGATIVE 05/01/2021 1217   NITRITE NEGATIVE 05/01/2021 1217   LEUKOCYTESUR MODERATE (A) 05/01/2021 1217    Microbiology Recent Results (from the past 240 hour(s))  Resp Panel by RT-PCR (Flu A&B, Covid) Nasopharyngeal Swab     Status: None   Collection Time: 04/30/21 11:36 PM   Specimen: Nasopharyngeal Swab; Nasopharyngeal(NP) swabs in vial transport medium  Result Value Ref Range Status   SARS Coronavirus 2 by RT PCR NEGATIVE NEGATIVE Final    Comment: (NOTE) SARS-CoV-2 target nucleic acids are NOT DETECTED.  The SARS-CoV-2 RNA is generally detectable in upper respiratory specimens during the acute phase of infection. The lowest concentration of SARS-CoV-2 viral copies this assay can detect is 138 copies/mL. A negative result does not preclude SARS-Cov-2 infection and should not be used as the sole basis for treatment or other patient management decisions. A negative result may occur with  improper specimen collection/handling, submission of specimen other than nasopharyngeal swab, presence of viral mutation(s) within the areas targeted by this assay, and inadequate number of viral copies(<138 copies/mL). A negative result must be combined with clinical observations, patient history, and epidemiological information. The expected result is Negative.  Fact Sheet for Patients:  EntrepreneurPulse.com.au  Fact Sheet for Healthcare Providers:  IncredibleEmployment.be  This test is no t yet approved or cleared by the [place] FDA and  has been authorized for detection and/or  diagnosis of SARS-CoV-2 by FDA under an Emergency Use Authorization (EUA). This EUA will remain  in effect (meaning this test can be used) for the duration of the COVID-19 declaration under Section 564(b)(1) of the Act, 21 U.S.C.section 360bbb-3(b)(1), unless the authorization is terminated  or revoked sooner.       Influenza A by PCR NEGATIVE NEGATIVE Final   Influenza B by PCR NEGATIVE NEGATIVE Final    Comment: (NOTE) The Xpert Xpress SARS-CoV-2/FLU/RSV plus assay is intended as an aid in the diagnosis of influenza from Nasopharyngeal swab specimens and should not be used as a sole basis for treatment. Nasal washings and aspirates are unacceptable for Xpert Xpress SARS-CoV-2/FLU/RSV testing.  Fact Sheet for Patients: EntrepreneurPulse.com.au  Fact Sheet for Healthcare Providers: IncredibleEmployment.be  This test is not yet approved or cleared by the [place] FDA and has been authorized for detection and/or diagnosis of SARS-CoV-2 by FDA under an Emergency Use Authorization (EUA).  This EUA will remain in effect (meaning this test can be used) for the duration of the COVID-19 declaration under Section 564(b)(1) of the Act, 21 U.S.C. section 360bbb-3(b)(1), unless the authorization is terminated or revoked.  Performed at [facility], [place] [address] [place], [place] [address]   Urine Culture     Status: Abnormal (Preliminary result)   Collection Time: 05/01/21 12:17 PM   Specimen: Urine, Catheterized  Result Value Ref Range Status   Specimen Description URINE, CATHETERIZED  Final   Special Requests NONE  Final   Culture (A)  Final    40,000 COLONIES/mL GRAM NEGATIVE RODS SUSCEPTIBILITIES TO FOLLOW Performed at [facility], [place] [address], [place], [place] [address]    Report Status PENDING  Incomplete    Time coordinating discharge: Approximately 40 minutes  Patrecia Pour, MD  [facility] 05/02/2021, 2:15  PM

## 2021-05-02 NOTE — Evaluation (Signed)
Occupational Therapy Evaluation Patient Details Name: Brooke Larson MRN: [id number] DOB: [birth year] Today's Date: 05/02/2021   History of Present Illness Brooke Larson is a 88 yo female who presented with confusion with high BPs in 190s. Workup thus far shows a lacunar infarct and 2 other small acute infarcts in the L MCA. PMHx of hypothyroidism, stroke in 2015 with no sequelae on Plavix, HLD, and HTN.   Clinical Impression   Brooke Larson reports being indep PTA including driving. She lives in a 1 level home, with 3 children who all live close by and can assist pt at d/c. Upon evaluation pt demonstrated mod I ability to complete functional mobility and ADLs. Reviewed stroke education, fall prevention and safety education; pt and her son verbalized understanding. Pt did report she is hesitant to take her new medications, reporting that she is "old school" and does not believe everything that people tell her (in regard to medication education). Reinforced the importance of taking medication as prescribed and stroke risk factors. Pt's son stated that the pt's daughter will assist in med management at d/c. Pt does not required further OT acutely. Recommend d/c home without follow up OT.      Recommendations for follow up therapy are one component of a multi-disciplinary discharge planning process, led by the attending physician.  Recommendations may be updated based on patient status, additional functional criteria and insurance authorization.   Follow Up Recommendations  No OT follow up    Assistance Recommended at Discharge Frequent or constant Supervision/Assistance (for safety)  Patient can return home with the following Direct supervision/assist for medications management;Assistance with cooking/housework    Functional Status Assessment  Patient has had a recent decline in their functional status and demonstrates the ability to make significant improvements in function in a reasonable and predictable  amount of time.  Equipment Recommendations  None recommended by OT    Recommendations for Other Services       Precautions / Restrictions Precautions Precautions: Fall Restrictions Weight Bearing Restrictions: No      Mobility Bed Mobility Overal bed mobility: Modified Independent             General bed mobility comments: pt sitting EOB upon arrival, RN report mod I bed mobility    Transfers Overall transfer level: Modified independent Equipment used: None                      Balance Overall balance assessment: Modified Independent, Mild deficits observed, not formally tested                                         ADL either performed or assessed with clinical judgement   ADL Overall ADL's : At baseline;Modified independent                                       General ADL Comments: Pt completes ADLs at her mod I baseline. No LOB noted. Encouraged use of AD for functional ambulation and family assist for tub transfers and IADLs.     Vision Baseline Vision/History: 0 No visual deficits Ability to See in Adequate Light: 0 Adequate Patient Visual Report: No change from baseline Vision Assessment?: No apparent visual deficits     Perception     Praxis  Pertinent Vitals/Pain Pain Assessment Pain Assessment: No/denies pain     Hand Dominance Right   Extremity/Trunk Assessment Upper Extremity Assessment Upper Extremity Assessment: Overall WFL for tasks assessed   Lower Extremity Assessment Lower Extremity Assessment: Overall WFL for tasks assessed   Cervical / Trunk Assessment Cervical / Trunk Assessment: Normal   Communication Communication Communication: No difficulties   Cognition Arousal/Alertness: Awake/alert Behavior During Therapy: WFL for tasks assessed/performed Overall Cognitive Status: Impaired/Different from baseline Area of Impairment: Safety/judgement, Awareness                          Safety/Judgement: Decreased awareness of safety, Decreased awareness of deficits Awareness: Emergent   General Comments: pt admits that she is "old school," and does not like to take medication. She also stated "I don't trust everything I am told," in reference to medication education. She has poor insight to safety and deficits.     General Comments  VSS on RA. pt's son present and supportive    Exercises     Shoulder Instructions      Home Living Family/patient expects to be discharged to:: Private residence Living Arrangements: Alone Available Help at Discharge: Family;Available PRN/intermittently Type of Home: House       Home Layout: One level     Bathroom Shower/Tub: Tub/shower unit;Walk-in shower   Bathroom Toilet: Standard     Home Equipment: Conservation officer, nature (2 wheels);Cane - single point;Cane - quad   Additional Comments: 3 children live near by - very supportive family  Lives With: Alone    Prior Functioning/Environment Prior Level of Function : Independent/Modified Independent;Driving             Mobility Comments: independent in the home, uses cane when outdoors. ADLs Comments: independent        OT Problem List: Decreased activity tolerance;Decreased cognition;Decreased safety awareness;Decreased knowledge of precautions      OT Treatment/Interventions: Self-care/ADL training;Therapeutic exercise;Therapeutic activities;Cognitive remediation/compensation;Patient/family education;Balance training;DME and/or AE instruction    OT Goals(Current goals can be found in the care plan section) Acute Rehab OT Goals Patient Stated Goal: home OT Goal Formulation: All assessment and education complete, DC therapy Time For Goal Achievement: 05/16/21  OT Frequency: Min 2X/week    Co-evaluation              AM-PAC OT "6 Clicks" Daily Activity     Outcome Measure Help from another person eating meals?: None Help from another person taking  care of personal grooming?: A Little Help from another person toileting, which includes using toliet, bedpan, or urinal?: A Little Help from another person bathing (including washing, rinsing, drying)?: A Little Help from another person to put on and taking off regular upper body clothing?: None Help from another person to put on and taking off regular lower body clothing?: A Little 6 Click Score: 20   End of Session Nurse Communication: Mobility status  Activity Tolerance: Patient tolerated treatment well Patient left: in bed;with call bell/phone within reach;with family/visitor present  OT Visit Diagnosis: Unsteadiness on feet (R26.81)                Time: 8453-6468 OT Time Calculation (min): 16 min Charges:  OT General Charges $OT Visit: 1 Visit OT Evaluation $OT Eval Low Complexity: 1 Low   Devonna Oboyle A Juno Alers 05/02/2021, 1:37 PM

## 2021-05-02 NOTE — Plan of Care (Signed)
°  Problem: Education: Goal: Knowledge of disease or condition will improve Outcome: Progressing Goal: Knowledge of secondary prevention will improve (SELECT ALL) Outcome: Progressing   Problem: Self-Care: Goal: Ability to participate in self-care as condition permits will improve Outcome: Progressing

## 2021-05-02 NOTE — Evaluation (Signed)
Physical Therapy Evaluation Patient Details Name: Brooke Larson MRN: [id number] DOB: 02/16/1932 Today's Date: 05/02/2021  History of Present Illness  Brooke Larson is a 88 yo female with a PMHx of hypothyroidism, stroke in 2015 with no sequelae on Plavix, HLD, and HTN. Patient presented today with confusion with high BPs in 190s. Her last seen normal was 04/28/21. Patient lives alone. Son found her to be confused on the 16th and 17th. Daughter brought her in. Workup thus far shows a lacunar infarct and 2 other small acute infarcts in the L MCA.   Clinical Impression  Patient received sitting up on side of bed, daughter present in room. Patient oriented to place, time, self. She is agreeable to PT assessment. Patient performed sit to stand with min guard/supervision. Ambulated 400 feet with min guard, no AD. One small lob (stumble) requiring min guard for correction. She appears to be close to baseline with cognition and mobility. She will continue to benefit from skilled PT while here to ensure safety with mobility.          Recommendations for follow up therapy are one component of a multi-disciplinary discharge planning process, led by the attending physician.  Recommendations may be updated based on patient status, additional functional criteria and insurance authorization.  Follow Up Recommendations No PT follow up- children all live nearby and will be able to assist with her needs    Assistance Recommended at Discharge Frequent or constant Supervision/Assistance  Patient can return home with the following  Assistance with cooking/housework;Direct supervision/assist for medications management;Direct supervision/assist for financial management;Help with stairs or ramp for entrance;Assist for transportation    Equipment Recommendations None recommended by PT  Recommendations for Other Services       Functional Status Assessment Patient has had a recent decline in their functional status and  demonstrates the ability to make significant improvements in function in a reasonable and predictable amount of time.     Precautions / Restrictions Precautions Precautions: Fall Restrictions Weight Bearing Restrictions: No      Mobility  Bed Mobility               General bed mobility comments: patient received sitting up on side of bed. Family present    Transfers Overall transfer level: Modified independent Equipment used: None                    Ambulation/Gait Ambulation/Gait assistance: Counsellor (Feet): 400 Feet Assistive device: None Gait Pattern/deviations: Step-through pattern, WFL(Within Functional Limits) Gait velocity: slightly decr     General Gait Details: 1 small stumble with min guard assist. No neglect noted or difficulty with obstacle avoidance in room/hall  Stairs            Wheelchair Mobility    Modified Rankin (Stroke Patients Only) Modified Rankin (Stroke Patients Only) Pre-Morbid Rankin Score: No symptoms Modified Rankin: Moderate disability     Balance Overall balance assessment: Modified Independent, Mild deficits observed, not formally tested                                           Pertinent Vitals/Pain Pain Assessment Pain Assessment: No/denies pain    Home Living Family/patient expects to be discharged to:: Private residence Living Arrangements: Alone Available Help at Discharge: Family;Available PRN/intermittently Type of Home: [place]  Layout: One level Home Equipment: Conservation officer, nature (2 wheels);Cane - single point;Cane - quad      Prior Function Prior Level of Function : Independent/Modified Independent;Driving             Mobility Comments: independent in the home, uses cane when outdoors. ADLs Comments: independent     Hand Dominance        Extremity/Trunk Assessment   Upper Extremity Assessment Upper Extremity Assessment: Defer to OT  evaluation    Lower Extremity Assessment Lower Extremity Assessment: Overall WFL for tasks assessed    Cervical / Trunk Assessment Cervical / Trunk Assessment: Normal  Communication   Communication: No difficulties  Cognition Arousal/Alertness: Awake/alert Behavior During Therapy: WFL for tasks assessed/performed Overall Cognitive Status: History of cognitive impairments - at baseline                                          General Comments      Exercises     Assessment/Plan    PT Assessment Patient needs continued PT services  PT Problem List Decreased mobility;Decreased balance;Decreased safety awareness       PT Treatment Interventions Therapeutic activities;Gait training;Therapeutic exercise;Functional mobility training;Stair training;Balance training;Patient/family education    PT Goals (Current goals can be found in the Care Plan section)  Acute Rehab PT Goals Patient Stated Goal: to return home with family supervision PT Goal Formulation: With patient/family Time For Goal Achievement: 05/09/21 Potential to Achieve Goals: Good    Frequency Min 3X/week     Co-evaluation               AM-PAC PT "6 Clicks" Mobility  Outcome Measure Help needed turning from your back to your side while in a flat bed without using bedrails?: None Help needed moving from lying on your back to sitting on the side of a flat bed without using bedrails?: None Help needed moving to and from a bed to a chair (including a wheelchair)?: A Little Help needed standing up from a chair using your arms (e.g., wheelchair or bedside chair)?: A Little Help needed to walk in hospital room?: A Little Help needed climbing 3-5 steps with a railing? : A Little 6 Click Score: 20    End of Session Equipment Utilized During Treatment: Gait belt Activity Tolerance: Patient tolerated treatment well Patient left: in bed;with call bell/phone within reach;with family/visitor  present Nurse Communication: Mobility status PT Visit Diagnosis: Unsteadiness on feet (R26.81)    Time: [id number] PT Time Calculation (min) (ACUTE ONLY): 13 min   Charges:   PT Evaluation $PT Eval Moderate Complexity: 1 Mod          Baltasar Twilley, PT, GCS 05/02/21,10:29 AM

## 2021-05-02 NOTE — TOC Benefit Eligibility Note (Signed)
Patient Teacher, English as a foreign language completed.    The patient is currently admitted and upon discharge could be taking Eliquis 2.5 mg.  The current 30 day co-pay is, $47.00.   The patient is insured through [place], [place] Patient Advocate Specialist [place] Patient Advocate Team Direct Number: [phone]  Fax: [phone]

## 2021-05-02 NOTE — Discharge Instructions (Signed)

## 2021-05-02 NOTE — Progress Notes (Signed)
°  Transition of Care [facility]) Screening Note   Patient Details  Name: Brooke Larson Date of Birth: [birth year]   [facility] CM/SW Contact:    Pollie Friar, RN Phone Number: 05/02/2021, 1:52 PM    Transition of Care Department [facility]) has reviewed patient and no TOC needs have been identified at this time. We will continue to monitor patient advancement through interdisciplinary progression rounds. If new patient transition needs arise, please place a TOC consult.

## 2021-05-02 NOTE — Progress Notes (Addendum)
STROKE TEAM PROGRESS NOTE   INTERVAL HISTORY Her son, Timmothy Sours, is at the bedside. Family states that she is not very good at taking medications, has fallen in the past. Heparin switched to eliquis initally, EKGs show NSR with PACs. Marland Kitchen LDL 161, Ha1C 5.1. Since no afib has been seen and patient is reluctant to take Eliquis, recommend DAPT with Plavix and ASA.  MRI shows a small left basal ganglia lacunar infarct along with 2 other tiny deep white matter subcortical infarcts which appears to be clinically asymptomatic and patient denies any focal neurological symptoms related to this. Vitals:   05/01/21 1602 05/01/21 1700 05/02/21 0014 05/02/21 0351  BP: (!) 171/79  (!) 161/78 139/71  Pulse: 67  74 96  Resp: 18  20 18   Temp: 98.1 F (36.7 C)  98.1 F (36.7 C) 98.4 F (36.9 C)  TempSrc: Oral  Oral   SpO2: 100%  98% 97%  Weight:  55 kg    Height:  5\' 2"  (1.575 m)     CBC:  Recent Labs  Lab 04/30/21 2347 05/01/21 0019 05/02/21 0154  WBC 8.7  --  5.5  NEUTROABS 6.1  --   --   HGB 14.2 13.6 13.1  HCT 41.8 40.0 36.8  MCV 96.1  --  93.6  PLT 198  --  297   Basic Metabolic Panel:  Recent Labs  Lab 04/30/21 2347 05/01/21 0019  NA 139 141  K 3.7 3.7  CL 105 103  CO2 27  --   GLUCOSE 117* 109*  BUN 18 21  CREATININE 1.12* 1.00  CALCIUM 9.4  --    Lipid Panel:  Recent Labs  Lab 05/01/21 1130  CHOL 237*  TRIG 120  HDL 52  CHOLHDL 4.6  VLDL 24  LDLCALC 161*   HgbA1c:  Recent Labs  Lab 05/01/21 1138  HGBA1C 5.1   Urine Drug Screen:  Recent Labs  Lab 05/01/21 1217  LABOPIA NONE DETECTED  COCAINSCRNUR NONE DETECTED  LABBENZ NONE DETECTED  AMPHETMU NONE DETECTED  THCU NONE DETECTED  LABBARB NONE DETECTED    Alcohol Level  Recent Labs  Lab 04/30/21 2347  ETH <10    IMAGING past 24 hours MR ANGIO HEAD WO CONTRAST  Result Date: 05/02/2021 CLINICAL DATA:  Follow-up examination for stroke. EXAM: MRA NECK WITHOUT CONTRAST MRA HEAD WITHOUT CONTRAST TECHNIQUE:  Angiographic images of the Circle of Willis were acquired using MRA technique without intravenous contrast. COMPARISON:  Comparison made with prior MRI from 05/01/2021. FINDINGS: MRA NECK FINDINGS AORTIC ARCH: Examination severely degraded by motion artifact. Visualized aortic arch grossly within normal limits for caliber with normal branch pattern. No visible stenosis or other abnormality about the origin of the great vessels. RIGHT CAROTID SYSTEM: Visualized right CCA patent without stenosis. Mild atheromatous irregularity about the right carotid bulb/proximal right ICA without hemodynamically significant stenosis. Visualized right ICA otherwise grossly patent without stenosis or evidence for dissection. LEFT CAROTID SYSTEM: Visualized left CCA patent without stenosis. Mild atheromatous irregularity about the left carotid bulb/proximal left ICA without hemodynamically significant stenosis. Visualized left ICA otherwise patent without stenosis or evidence for dissection. VERTEBRAL ARTERIES: Left vertebral artery likely arises directly from the aortic arch. Proximal aspects of both vertebral arteries poorly assessed on this exam due to motion. Visualized portions of the vertebral arteries grossly patent without visible stenosis or evidence for dissection. MRA HEAD FINDINGS ANTERIOR CIRCULATION: Examination moderately degraded by motion artifact. Visualized distal cervical segments of the internal carotid arteries are patent  with antegrade flow. Petrous segments patent bilaterally. Probable mild atheromatous irregularity within the carotid siphons without hemodynamically significant stenosis. A1 segments patent bilaterally. Grossly normal anterior communicating artery complex. Possible short-segment moderate stenosis involving the proximal left A2 segment (series 1013, image 10). ACAs otherwise grossly patent to their distal aspects without significant stenosis. Right M1 segment patent. Grossly normal right MCA  bifurcation. Probable focal severe proximal right M2 stenosis (series 5, image 93). Distal right MCA branches appear grossly perfused on time-of-flight sequence, although are attenuated as compared to the contralateral left MCA branches. Finding in keeping with the chronic right MCA distribution infarcts seen on prior MRI. Atheromatous irregularity with mild to moderate diffuse narrowing of the mid-distal left M1 segment. Normal left MCA bifurcation. Distal left MCA branches remain patent and perfused. POSTERIOR CIRCULATION: Visualized distal V4 segments patent without stenosis. Both PICA grossly patent at their origins. Basilar patent to its distal aspect without stenosis. Superior cerebellar arteries patent bilaterally. Both PCAs primarily supplied via the basilar. Short-segment moderate to severe proximal left P2 stenosis (series 1027, image 7). PCAs irregular but otherwise grossly patent to their distal aspects bilaterally. No visible intracranial aneurysm. IMPRESSION: MRA HEAD IMPRESSION: 1. Motion degraded exam. 2. Negative intracranial MRA for large vessel occlusion. 3. Severe proximal right M2 stenosis. Distal right MCA branches are grossly patent but attenuated as compared to the left. Finding is consistent with the chronic right MCA territory infarct as seen on prior MRI. 4. Additional intracranial atherosclerotic disease as above. Notable findings include a moderate to severe proximal left P2 stenosis, with possible short-segment moderate left A2 stenosis. MRA NECK IMPRESSION: 1. Severely motion degraded exam. 2. Mild for age atheromatous change about the carotid bifurcations without significant stenosis. Both carotid artery systems patent without hemodynamically significant stenosis. 3. Wide patency of both vertebral arteries within the neck. Electronically Signed   By: Jeannine Boga M.D.   On: 05/02/2021 05:37   MR ANGIO NECK WO CONTRAST  Result Date: 05/02/2021 CLINICAL DATA:  Follow-up  examination for stroke. EXAM: MRA NECK WITHOUT CONTRAST MRA HEAD WITHOUT CONTRAST TECHNIQUE: Angiographic images of the Circle of Willis were acquired using MRA technique without intravenous contrast. COMPARISON:  Comparison made with prior MRI from 05/01/2021. FINDINGS: MRA NECK FINDINGS AORTIC ARCH: Examination severely degraded by motion artifact. Visualized aortic arch grossly within normal limits for caliber with normal branch pattern. No visible stenosis or other abnormality about the origin of the great vessels. RIGHT CAROTID SYSTEM: Visualized right CCA patent without stenosis. Mild atheromatous irregularity about the right carotid bulb/proximal right ICA without hemodynamically significant stenosis. Visualized right ICA otherwise grossly patent without stenosis or evidence for dissection. LEFT CAROTID SYSTEM: Visualized left CCA patent without stenosis. Mild atheromatous irregularity about the left carotid bulb/proximal left ICA without hemodynamically significant stenosis. Visualized left ICA otherwise patent without stenosis or evidence for dissection. VERTEBRAL ARTERIES: Left vertebral artery likely arises directly from the aortic arch. Proximal aspects of both vertebral arteries poorly assessed on this exam due to motion. Visualized portions of the vertebral arteries grossly patent without visible stenosis or evidence for dissection. MRA HEAD FINDINGS ANTERIOR CIRCULATION: Examination moderately degraded by motion artifact. Visualized distal cervical segments of the internal carotid arteries are patent with antegrade flow. Petrous segments patent bilaterally. Probable mild atheromatous irregularity within the carotid siphons without hemodynamically significant stenosis. A1 segments patent bilaterally. Grossly normal anterior communicating artery complex. Possible short-segment moderate stenosis involving the proximal left A2 segment (series 1013, image 10). ACAs otherwise grossly patent to  their distal  aspects without significant stenosis. Right M1 segment patent. Grossly normal right MCA bifurcation. Probable focal severe proximal right M2 stenosis (series 5, image 93). Distal right MCA branches appear grossly perfused on time-of-flight sequence, although are attenuated as compared to the contralateral left MCA branches. Finding in keeping with the chronic right MCA distribution infarcts seen on prior MRI. Atheromatous irregularity with mild to moderate diffuse narrowing of the mid-distal left M1 segment. Normal left MCA bifurcation. Distal left MCA branches remain patent and perfused. POSTERIOR CIRCULATION: Visualized distal V4 segments patent without stenosis. Both PICA grossly patent at their origins. Basilar patent to its distal aspect without stenosis. Superior cerebellar arteries patent bilaterally. Both PCAs primarily supplied via the basilar. Short-segment moderate to severe proximal left P2 stenosis (series 1027, image 7). PCAs irregular but otherwise grossly patent to their distal aspects bilaterally. No visible intracranial aneurysm. IMPRESSION: MRA HEAD IMPRESSION: 1. Motion degraded exam. 2. Negative intracranial MRA for large vessel occlusion. 3. Severe proximal right M2 stenosis. Distal right MCA branches are grossly patent but attenuated as compared to the left. Finding is consistent with the chronic right MCA territory infarct as seen on prior MRI. 4. Additional intracranial atherosclerotic disease as above. Notable findings include a moderate to severe proximal left P2 stenosis, with possible short-segment moderate left A2 stenosis. MRA NECK IMPRESSION: 1. Severely motion degraded exam. 2. Mild for age atheromatous change about the carotid bifurcations without significant stenosis. Both carotid artery systems patent without hemodynamically significant stenosis. 3. Wide patency of both vertebral arteries within the neck. Electronically Signed   By: Jeannine Boga M.D.   On: 05/02/2021 05:37    DG Chest Port 1 View  Result Date: 05/01/2021 CLINICAL DATA:  Weakness.  Altered mental status and confusion. EXAM: PORTABLE CHEST 1 VIEW COMPARISON:  05/13/2017 FINDINGS: Normal heart size. No pleural effusion or edema. No airspace opacities identified. The visualized osseous structures are unremarkable. IMPRESSION: No acute cardiopulmonary abnormalities. Electronically Signed   By: Kerby Moors M.D.   On: 05/01/2021 11:05   ECHOCARDIOGRAM COMPLETE  Result Date: 05/01/2021    ECHOCARDIOGRAM REPORT   Patient Name:   Brooke Larson Date of Exam: 05/01/2021 Medical Rec #:  [id number]      Height:       61.0 in Accession #:    [id number]     Weight:       132.3 lb Date of Birth:  12/14/1931     BSA:          1.584 m Patient Age:    67 years       BP:           140/74 mmHg Patient Gender: F              HR:           61 bpm. Exam Location:  Inpatient Procedure: 2D Echo, Cardiac Doppler and Color Doppler Indications:    Stroke  History:        Patient has no prior history of Echocardiogram examinations.  Sonographer:    Jyl Heinz Referring Phys: [id number] RONDELL A SMITH IMPRESSIONS  1. Left ventricular ejection fraction, by estimation, is 60 to 65%. The left ventricle has normal function. The left ventricle has no regional wall motion abnormalities. Left ventricular diastolic function could not be evaluated.  2. Right ventricular systolic function is normal. The right ventricular size is normal. There is normal pulmonary artery systolic pressure. The estimated right ventricular systolic pressure is  27.4 mmHg.  3. The mitral valve is degenerative. Mild mitral valve regurgitation. Mild mitral stenosis. The mean mitral valve gradient is 4.0 mmHg with average heart rate of 68 bpm.  4. The aortic valve is tricuspid. There is mild calcification of the aortic valve. There is mild thickening of the aortic valve. Aortic valve regurgitation is trivial. Mild aortic valve stenosis.  5. The inferior vena cava is normal  in size with greater than 50% respiratory variability, suggesting right atrial pressure of 3 mmHg. Conclusion(s)/Recommendation(s): No intracardiac source of embolism detected on this transthoracic study. Consider a transesophageal echocardiogram to exclude cardiac source of embolism if clinically indicated. FINDINGS  Left Ventricle: Left ventricular ejection fraction, by estimation, is 60 to 65%. The left ventricle has normal function. The left ventricle has no regional wall motion abnormalities. The left ventricular internal cavity size was normal in size. There is  no left ventricular hypertrophy. Left ventricular diastolic function could not be evaluated due to mitral annular calcification (moderate or greater). Left ventricular diastolic function could not be evaluated. Right Ventricle: The right ventricular size is normal. No increase in right ventricular wall thickness. Right ventricular systolic function is normal. There is normal pulmonary artery systolic pressure. The tricuspid regurgitant velocity is 2.47 m/s, and  with an assumed right atrial pressure of 3 mmHg, the estimated right ventricular systolic pressure is 03.5 mmHg. Left Atrium: Left atrial size was normal in size. Right Atrium: Right atrial size was normal in size. Pericardium: There is no evidence of pericardial effusion. Mitral Valve: The mitral valve is degenerative in appearance. Mild mitral valve regurgitation. Mild mitral valve stenosis. MV peak gradient, 6.6 mmHg. The mean mitral valve gradient is 4.0 mmHg with average heart rate of 68 bpm. Tricuspid Valve: The tricuspid valve is grossly normal. Tricuspid valve regurgitation is mild . No evidence of tricuspid stenosis. Aortic Valve: The aortic valve is tricuspid. There is mild calcification of the aortic valve. There is mild thickening of the aortic valve. Aortic valve regurgitation is trivial. Mild aortic stenosis is present. Aortic valve mean gradient measures 17.0 mmHg. Aortic valve  peak gradient measures 25.2 mmHg. Aortic valve area, by VTI measures 1.45 cm. Pulmonic Valve: The pulmonic valve was grossly normal. Pulmonic valve regurgitation is not visualized. No evidence of pulmonic stenosis. Aorta: The aortic root and ascending aorta are structurally normal, with no evidence of dilitation. Venous: The inferior vena cava is normal in size with greater than 50% respiratory variability, suggesting right atrial pressure of 3 mmHg. IAS/Shunts: The atrial septum is grossly normal.  LEFT VENTRICLE PLAX 2D LVIDd:         4.20 cm     Diastology LVIDs:         2.90 cm     LV e' medial:    4.82 cm/s LV PW:         0.90 cm     LV E/e' medial:  18.7 LV IVS:        0.90 cm     LV e' lateral:   6.85 cm/s LVOT diam:     1.80 cm     LV E/e' lateral: 13.1 LV SV:         71 LV SV Index:   45 LVOT Area:     2.54 cm  LV Volumes (MOD) LV vol d, MOD A2C: 65.7 ml LV vol d, MOD A4C: 69.5 ml LV vol s, MOD A2C: 23.0 ml LV vol s, MOD A4C: 26.3 ml LV SV MOD  A2C:     42.7 ml LV SV MOD A4C:     69.5 ml LV SV MOD BP:      42.5 ml RIGHT VENTRICLE            IVC RV Basal diam:  2.50 cm    IVC diam: 1.40 cm RV Mid diam:    2.60 cm RV S prime:     6.48 cm/s TAPSE (M-mode): 1.6 cm LEFT ATRIUM             Index        RIGHT ATRIUM           Index LA diam:        3.70 cm 2.34 cm/m   RA Area:     10.20 cm LA Vol (A2C):   26.1 ml 16.47 ml/m  RA Volume:   21.40 ml  13.51 ml/m LA Vol (A4C):   32.0 ml 20.20 ml/m LA Biplane Vol: 29.4 ml 18.56 ml/m  AORTIC VALVE AV Area (Vmax):    1.32 cm AV Area (Vmean):   1.39 cm AV Area (VTI):     1.45 cm AV Vmax:           251.00 cm/s AV Vmean:          185.000 cm/s AV VTI:            0.492 m AV Peak Grad:      25.2 mmHg AV Mean Grad:      17.0 mmHg LVOT Vmax:         130.50 cm/s LVOT Vmean:        101.200 cm/s LVOT VTI:          0.279 m LVOT/AV VTI ratio: 0.57  AORTA Ao Root diam: 2.50 cm Ao Asc diam:  3.70 cm MITRAL VALVE                TRICUSPID VALVE MV Area (PHT): 2.32 cm     TR Peak  grad:   24.4 mmHg MV Area VTI:   1.78 cm     TR Vmax:        247.00 cm/s MV Peak grad:  6.6 mmHg MV Mean grad:  4.0 mmHg     SHUNTS MV Vmax:       1.28 m/s     Systemic VTI:  0.28 m MV Vmean:      96.4 cm/s    Systemic Diam: 1.80 cm MV Decel Time: 327 msec MV E velocity: 90.00 cm/s MV A velocity: 120.00 cm/s MV E/A ratio:  0.75 Eleonore Chiquito MD Electronically signed by Eleonore Chiquito MD Signature Date/Time: 05/01/2021/2:34:29 PM    Final     PHYSICAL EXAM  Physical Exam  Constitutional: Appears frail elderly, pleasant caucasian female.  Psych: Affect appropriate to situation Eyes: No scleral injection HENT: No OP obstrucion MSK: no joint deformities.  Cardiovascular: Normal rate and regular rhythm.  Respiratory: Effort normal, non-labored breathing GI: Soft.  No distension. There is no tenderness.  Skin: WDI  Neuro: Mental Status: Patient is awake, alert, oriented to person, place, and new it was 2023, she stated that today was Monday (Wednesday),  Patient is able to give a clear and coherent history. No signs of aphasia or neglect Cranial Nerves: II: Visual Fields are full. Pupils are equal, round, and reactive to light.   III,IV, VI: EOMI without ptosis or diploplia.  V: Facial sensation is symmetric to temperature VII: Facial movement is symmetric resting and smiling VIII:  Hearing is intact to voice X: Palate elevates symmetrically XI: Shoulder shrug is symmetric. XII: Tongue protrudes midline without atrophy or fasciculations.  Motor: Tone is normal. Bulk is normal. 5/5 strength was present in all four extremities.  Sensory: Sensation is symmetric to light touch and temperature in the arms and legs. No extinction to DSS present.  Coordination: FNF intact bilaterally   ASSESSMENT/PLAN Brooke Larson is a 88 y.o. female with history of hypothyroidism, stroke in 2015 with no sequelae on Plavix, HLD, and HTN. presenting with confusion with high BPs in 190s. Her last seen  normal was 04/28/21. Imaging shows a lacunar infarct and 2 other small acute infarcts in the L MCA. Patient has a history of non compliance with medications including Plavix. Daughter states her old stroke was [facility] at [facility]. Patient was initially started on heparin per stroke protocol, per Consult note, patient was in atrial fibrillation on the tele monitor.   Stroke:  left basal ganglia/lentiform embolus tiny deep white matter lacunar infarcts in the left MCA territory likely secondary to small vessel disease.  Suspect patient has baseline mild dementia  code Stroke - Chronic atrophic and ischemic changes without acute abnormality MRI  acute lacunar infarct in the posterior left lentiform, 2 punctate acute left MCA territory  MRA- Severe proximal right M2 stenosis. Distal right MCA branches are grossly patent but attenuated as compared to the left. Severe proximal left P2 stenosis. progressed underlying advanced small vessel disease, and right MCA ischemia since 2016. Chronic microhemorrhages in the pons. 2D Echo 60-65%, mild mitral valve regurgitation LDL 161 HgbA1c 5.1 VTE prophylaxis - SCDs clopidogrel 75 mg daily prior to admission, now on ASA 81mg  and Clopidogrel 75mg  into 3 weeks and then aspirin alone Therapy recommendations:  No Follow up Disposition:  pending   Hypertension Home meds:  Coreg 12.5mg , valsartan 40mg  Stable Permissive hypertension (OK if < 220/120) but gradually normalize in 5-7 days Long-term BP goal normotensive  Hyperlipidemia Home meds:  None, resumed in hospital LDL 161, goal < 70 Add Atorvastatin 80mg   High intensity statin not indicated  Continue statin at discharge  Other Stroke Risk Factors Advanced Age >/= 67  Obesity, Body mass index is 22.18 kg/m., BMI >/= 30 associated with increased stroke risk, recommend weight loss, diet and exercise as appropriate  Hx stroke/TIA Right MCA infarct in 2015- previously on plavix Coronary artery disease   Hospital  day # 1  Patient seen and examined by NP/APP with MD. MD to update note as needed.   Janine Ores, DNP, FNP-BC [facility] Pager: [phone]  STROKE MD NOTE :  I have personally obtained history,examined this patient, reviewed notes, independently viewed imaging studies, participated in medical decision making and plan of care.ROS completed by me personally and pertinent positives fully documented  I have made any additions or clarifications directly to the above note. Agree with note above.  She presented with confusion and speech difficulties and MRI scan of the brain shows moderate left basal ganglia as well as tiny left subcortical white matter lacunar infarcts.  There is a question of A. fib on the EKG but in does not seem to be real.  Patient is very poorly compliant with her medications and hence is not a good long-term anticoagulation candidate hence did not recommend prolonged cardiac monitoring.  Recommend aspirin and Plavix for 3 weeks followed by aspirin alone and aggressive risk factor modification.  Long discussion with patient and son at the bedside and answered  questions.  Discussed with Dr. Thurmond Butts.  Greater than 50% time during this 50-minute visit was spent in counseling and coordination of care about her lacunar stroke discussion about stroke prevention treatment and answering questions.  Antony Contras, MD Medical Director [facility] Pager: [phone] 05/02/2021 2:12 PM   To contact Stroke Continuity provider, please refer to [email or web address] After hours, contact General Neurology

## 2021-05-02 NOTE — Evaluation (Signed)
Speech Language Pathology Evaluation Patient Details Name: Brooke Larson MRN: [id number] DOB: [birth year] Today's Date: 05/02/2021 Time: 1856-3149 SLP Time Calculation (min) (ACUTE ONLY): 26 min  Problem List:  Patient Active Problem List   Diagnosis Date Noted   CVA (cerebral vascular accident) ([place]) 70/26/3785   Acute metabolic encephalopathy 88/50/2774   Urinary tract infection 05/01/2021   New onset atrial fibrillation ([place]) 05/01/2021   Hyperlipidemia 05/01/2021   Hypothyroidism 05/01/2021   Dizziness and giddiness 09/22/2014   Ataxia 09/22/2014   Ischemic stroke ([place]) 09/22/2014   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 MRI brain 1/17: acute lacunar infarct in the posterior left lentiform and 2 punctate acute left MCA territory white matter infarcts. PMH: hypothyroidism, stroke in 2015 with no sequelae on Plavix, HLD, and HTN.   Assessment / Plan / Recommendation Clinical Impression  Pt was seen for speech/language/cognition evaluation with her son and daughter present. Pt reported that she lives alone. Pt's family indicated that the pt has been demonstrating some intermittent difficulty with medication management as it relates to her setting up her pillbox and consistently remembering to take medications. Pt and her family reported baseline difficulty with memory. All parties reported that the pt's cognitive-communication presentation is currently at baseline. Pt's motor speech skills were WNL. Pt demonstrated cognitive-linguistic deficits in the areas of memory, temporal  orientation, complex problem solving, and executive function. Pt's verbal and receptive language skills were functional for average communication. However, she exhibited pragmatic language impairments, and difficulty with auditory comprehension of larger amounts of information. Further acute skilled SLP services are not clinically indicated at this time since pt's family has reported these deficits to be chronic. Pt and her family were educated regarding results and recommendations; all parties verbalized understanding as Larson as agreement with plan of care.    SLP Assessment  SLP Recommendation/Assessment: Patient does not need any further Speech [place] SLP Visit Diagnosis: Cognitive communication deficit (R41.841)    Recommendations for follow up therapy are one component of a multi-disciplinary discharge planning process, led by the attending physician.  Recommendations may be updated based on patient status, additional functional criteria and insurance authorization.    Follow Up Recommendations  No SLP follow up    Assistance Recommended at Discharge  Intermittent Supervision/Assistance  Functional Status Assessment Patient has not had a recent decline in their functional status  Frequency and Duration           SLP Evaluation Cognition  Overall Cognitive Status: History of cognitive impairments - at baseline Arousal/Alertness: Awake/alert Orientation Level: Oriented to place;Oriented to person;Disoriented to situation Year: 2023 Month: January Day of Week: Incorrect Attention: Focused;Sustained Focused Attention: Appears intact Sustained Attention: Impaired Sustained Attention Impairment: Verbal complex Memory: Impaired Memory Impairment: Retrieval deficit;Decreased short term memory Awareness: Appears intact Problem Solving: Impaired Problem Solving Impairment: Verbal complex Executive Function: Organizing Organizing: Impaired       Comprehension   Auditory Comprehension Overall Auditory Comprehension: Impaired at baseline Basic Immediate Environment Questions:  (5/5) Complex Questions:  (4/5) Paragraph Comprehension (via  yes/no questions):  (0/4) Commands: Impaired Two Step Basic Commands:  (3/3) Multistep Basic Commands:  (2/3)    Expression Verbal Expression Overall Verbal Expression: Impaired at baseline Initiation: No impairment Repetition: No impairment Naming: Impairment Confrontation: Within functional limits Pragmatics: Impairment Impairments: Topic appropriateness;Topic maintenance   Oral / Motor  Oral Motor/Sensory Function Overall Oral Motor/Sensory Function: Within functional limits Motor Speech Overall Motor Speech: Appears within functional limits for tasks assessed Respiration: Within functional limits Phonation: Normal Resonance: Within functional limits Articulation: Within functional limitis Intelligibility: Intelligible Motor Planning: Witnin functional limits Motor Speech Errors: Not applicable           Hubbard Seldon I. Hardin Negus, [place], [place] Office number [phone] Pager [place] 05/02/2021, 10:17 AM

## 2021-05-02 NOTE — Progress Notes (Signed)
[facility] for IV Heparin Indication: atrial fibrillation  No Known Allergies  Patient Measurements: Height: 5\' 2"  (157.5 cm) Weight: 55 kg (121 lb 4.1 oz) IBW/kg (Calculated) : 50.1 Heparin Dosing Weight:  55 kg  Vital Signs: Temp: 98.1 F (36.7 C) (01/18 0014) Temp Source: Oral (01/18 0014) BP: 161/78 (01/18 0014) Pulse Rate: 74 (01/18 0014)  Labs: Recent Labs    04/30/21 2347 05/01/21 0019 05/02/21 0154  HGB 14.2 13.6 13.1  HCT 41.8 40.0 36.8  PLT 198  --  174  APTT 26  --   --   LABPROT 13.4  --   --   INR 1.0  --   --   HEPARINUNFRC  --   --  0.74*  CREATININE 1.12* 1.00  --      Estimated Creatinine Clearance: 31.3 mL/min (by C-G formula based on SCr of 1 mg/dL).   Medical History: Past Medical History:  Diagnosis Date   Hyperlipemia    Hypertension    Thyroid disease     Assessment: 88 yr old woman admitted on 04/30/21 with CVA (hx of stroke in 2015, on Plavix). No evidence of intracranial hemorrhage on CT or MRI. Pharmacy is consulted to dose IV heparin for atrial fibrillation. Pt rec'd enoxaparin 40 mg SQ X 1 ~1500 this afternoon.  H/H 13.6/40.0, plt 198  1/18 AM update:  Heparin level supra-therapeutic   Goal of Therapy:  Heparin level 0.3-0.5 units/ml Monitor platelets by anticoagulation protocol: Yes   Plan:  Dec heparin to 600 units/hr Re-check heparin level in 8 hours  Narda Bonds, PharmD, [place] Pharmacist Phone: [phone]

## 2021-05-02 NOTE — TOC Transition Note (Signed)
Transition of Care [facility]) - CM/SW Discharge Note   Patient Details  Name: Brooke Larson MRN: [id number] Date of Birth: [birth year]  [facility] Care [facility] CM/SW Contact:  Pollie Friar, RN Phone Number: 05/02/2021, 4:10 PM   Clinical Narrative:    Patient is discharging home with self care. No f/u per PT/OT. No DME needs.  Pt has transportation home.    Final next level of care: Home/Self Care Barriers to Discharge: No Barriers Identified   Patient Goals and CMS Choice        Discharge Placement                       Discharge Plan and Services                                     Social Determinants of Health (SDOH) Interventions     Readmission Risk Interventions No flowsheet data found.

## 2021-05-02 NOTE — Progress Notes (Signed)
ANTICOAGULATION CONSULT NOTE - Initial Consult  Pharmacy Consult for Apixaban Indication: atrial fibrillation, multiple CVAs  No Known Allergies  Patient Measurements: Height: 5\' 2"  (157.5 cm) Weight: 55 kg (121 lb 4.1 oz) IBW/kg (Calculated) : 50.1  Vital Signs: Temp: 98.1 F (36.7 C) (01/18 0833) Temp Source: Oral (01/18 0833) BP: 137/70 (01/18 0833) Pulse Rate: 69 (01/18 0833)  Labs: Recent Labs    04/30/21 2347 05/01/21 0019 05/02/21 0154  HGB 14.2 13.6 13.1  HCT 41.8 40.0 36.8  PLT 198  --  174  APTT 26  --   --   LABPROT 13.4  --   --   INR 1.0  --   --   HEPARINUNFRC  --   --  0.74*  CREATININE 1.12* 1.00  --     Estimated Creatinine Clearance: 31.3 mL/min (by C-G formula based on SCr of 1 mg/dL).   Medical History: Past Medical History:  Diagnosis Date   Hyperlipemia    Hypertension    Thyroid disease     Medications:  Scheduled:   apixaban  2.5 mg Oral BID   atorvastatin  80 mg Oral Daily   cyanocobalamin  1,000 mcg Intramuscular q1600   levothyroxine  50 mcg Oral QAC breakfast   multivitamin with minerals  1 tablet Oral Daily   Infusions:   cefTRIAXone (ROCEPHIN)  IV Stopped (05/01/21 1810)    Assessment: 88 yo F initially started on heparin for afib in the setting of new CVA.  Pharmacy has now been asked to switch to Apixaban.  PTA patient was not on anticoagulation, only on Plavix for secondary stroke prevention.  Goal of Therapy:  Therapeutic anticoagulation Monitor platelets by anticoagulation protocol: Yes   Plan:  Discontinue heparin infusion and associated labs Start apixaban 2.5mg  BID (dose reduced for age >50 and Wt < 60kg) Apixaban copay will be $47 Will follow-up with apixaban education prior to discharge.   [employer], Pharm.D., BCPS Clinical Pharmacist  **Pharmacist phone directory can be found on [email or web address] listed under [place].  05/02/2021 9:48 AM

## 2021-05-03 LAB — URINE CULTURE: Culture: 40000 — AB

## 2021-05-16 DIAGNOSIS — Z09 Encounter for follow-up examination after completed treatment for conditions other than malignant neoplasm: Secondary | ICD-10-CM | POA: Diagnosis not present

## 2021-05-16 DIAGNOSIS — E039 Hypothyroidism, unspecified: Secondary | ICD-10-CM | POA: Diagnosis not present

## 2021-05-16 DIAGNOSIS — E538 Deficiency of other specified B group vitamins: Secondary | ICD-10-CM | POA: Diagnosis not present

## 2021-05-17 DIAGNOSIS — Z87440 Personal history of urinary (tract) infections: Secondary | ICD-10-CM | POA: Diagnosis not present

## 2021-05-17 DIAGNOSIS — R300 Dysuria: Secondary | ICD-10-CM | POA: Diagnosis not present

## 2021-05-17 DIAGNOSIS — R8290 Unspecified abnormal findings in urine: Secondary | ICD-10-CM | POA: Diagnosis not present

## 2021-07-09 ENCOUNTER — Ambulatory Visit: Payer: Medicare Other | Admitting: Neurology

## 2021-07-09 ENCOUNTER — Encounter: Payer: Self-pay | Admitting: Neurology

## 2021-07-09 VITALS — BP 175/81 | HR 65 | Ht 62.0 in | Wt 123.0 lb

## 2021-07-09 DIAGNOSIS — I639 Cerebral infarction, unspecified: Secondary | ICD-10-CM

## 2021-07-09 DIAGNOSIS — F0390 Unspecified dementia without behavioral disturbance: Secondary | ICD-10-CM

## 2021-07-09 NOTE — Progress Notes (Signed)
? ?Chief Complaint  ?Patient presents with  ? New Patient (Initial Visit)  ?  Room 15, with daughter  ?Hosp f/u/Saw Dr. Leonie Man and needs f/u with MD, ?States she feels fine, no new sx, c/o difficulty sleeping,   ? ? ?ASSESSMENT AND PLAN ? ?Brooke Larson is a 88 y.o. female   ? ?Stroke ? Small vessel acute stroke, vascular risk factor of hypertension, hyperlipidemia, aging ? Is on aspirin 81 mg daily, ? ?Dementia ? MoCA examination of 14/30 ? Likely combination of central nervous system degenerative disorder with vascular component ? B12 supplement already, ? Emphasized importance of vascular risk factor control, moderate exercise ? ? ?DIAGNOSTIC DATA (LABS, IMAGING, TESTING) ?- I reviewed patient records, labs, notes, testing and imaging myself where available. ? ? ?MEDICAL HISTORY: ? ?Brooke Larson 88 year old female, seen in request by her primary care doctor Carol Ada, to follow-up on hospital discharge in January 2023 ? ?I reviewed and summarized the referring note. PMHX. ?HTN ?HLD ?Hypothyrodism ?CAD ?History of CVA ? ?She lives alone, noted to have gradual onset of memory loss over the past few years, while her family checked on her on April 30, 2021, she was noted to be confused, difficulty answering question, warning about acute stroke, she was brought to hospital for evaluation ? ?Upon admission, blood pressure was elevated 192/89, ? ?I personally reviewed MRI of the brain, acute lacunar infarction at the left external capsule, chronic right MCA cortical encephalomalacia involving right frontal gyri, widespread periventricular small vessel disease, with cerebral microhemorrhage in the pons, ? ?MRA of brain and neck showed no large vessel disease, intracranial atherosclerotic disease, ? ?Echocardiogram, ejection fraction 60 to 65%, no intraventricular thrombosis ?No significant valvular disease ? ?Laboratory evaluations, normal CBC, vitamin D, evidence of UTI ?A1c 5.1, LDL 161, cholesterol 672,  normal folic acid, B1, C94, TSH, ammonia, CMP creatinine 1.12, negative alcohol ? ?Telemetry reviewed normal sinus rhythm PACs, ? ?UTI was treated with ceftriaxone, followed by p.o. fosfomycin ? ?Acute metabolic encephalopathy related to UTI and cerebrovascular disease ? ?Started Lipitor 80 mg for elevated LDL, aspirin 81 mg ? ?She is now back home, checked by her family members regularly, denies new symptoms ? ?PHYSICAL EXAM: ?  ?Vitals:  ? 07/09/21 1405  ?BP: (!) 175/81  ?Pulse: 65  ?Weight: 123 lb (55.8 kg)  ?Height: '5\' 2"'$  (1.575 m)  ? ?Not recorded ?  ? ? ?Body mass index is 22.5 kg/m?. ? ?PHYSICAL EXAMNIATION: ? ?Gen: NAD, conversant, well nourised, well groomed                     ?Cardiovascular: Regular rate rhythm, no peripheral edema, warm, nontender. ?Eyes: Conjunctivae clear without exudates or hemorrhage ?Neck: Supple, no carotid bruits. ?Pulmonary: Clear to auscultation bilaterally  ? ?NEUROLOGICAL EXAM: ? ?MENTAL STATUS: ?Speech: ?   Speech is normal; fluent and spontaneous with normal comprehension.  ?Cognition: ?  ? ?  07/09/2021  ?  3:01 PM  ?Montreal Cognitive Assessment   ?Visuospatial/ Executive (0/5) 2  ?Naming (0/3) 2  ?Attention: Read list of digits (0/2) 2  ?Attention: Read list of letters (0/1) 1  ?Attention: Serial 7 subtraction starting at 100 (0/3) 0  ?Language: Repeat phrase (0/2) 1  ?Language : Fluency (0/1) 1  ?Abstraction (0/2) 1  ?Delayed Recall (0/5) 0  ?Orientation (0/6) 4  ?Total 14  ?  ?  ?CRANIAL NERVES: ?CN II: Visual fields are full to confrontation. Pupils are round equal and briskly reactive  to light. ?CN III, IV, VI: extraocular movement are normal. No ptosis. ?CN V: Facial sensation is intact to light touch ?CN VII: Face is symmetric with normal eye closure  ?CN VIII: Hearing is normal to causal conversation. ?CN IX, X: Phonation is normal. ?CN XI: Head turning and shoulder shrug are intact ? ?MOTOR: Normal strength ? ? ?REFLEXES: ?Reflexes are hypoactive and  symmetric ? ?SENSORY: ?Intact to light touch, pinprick and vibratory sensation are intact in fingers and toes. ? ?COORDINATION: ?There is no trunk or limb dysmetria noted. ? ?GAIT/STANCE: Need push-up to get up from seated position, unsteady  ? ?REVIEW OF SYSTEMS:  ?Full 14 system review of systems performed and notable only for as above ?All other review of systems were negative. ? ? ?ALLERGIES: ?No Known Allergies ? ?HOME MEDICATIONS: ?Current Outpatient Medications  ?Medication Sig Dispense Refill  ? aspirin EC 81 MG tablet Take 1 tablet (81 mg total) by mouth daily. Indefinitely 30 tablet 1  ? atorvastatin (LIPITOR) 80 MG tablet Take 1 tablet (80 mg total) by mouth daily. 30 tablet 1  ? carvedilol (COREG) 12.5 MG tablet Take 12.5 mg by mouth 2 (two) times daily with a meal.    ? levothyroxine (SYNTHROID) 50 MCG tablet Take 50 mcg by mouth daily before breakfast.    ? valsartan (DIOVAN) 40 MG tablet Take 40 mg by mouth daily.    ? vitamin B-12 (CYANOCOBALAMIN) 1000 MCG tablet Take 1 tablet (1,000 mcg total) by mouth daily. 30 tablet 1  ? ?No current facility-administered medications for this visit.  ? ? ?PAST MEDICAL HISTORY: ?Past Medical History:  ?Diagnosis Date  ? Hyperlipemia   ? Hypertension   ? Thyroid disease   ? ? ?PAST SURGICAL HISTORY: ?Past Surgical History:  ?Procedure Laterality Date  ? BALLOON ANGIOPLASTY, ARTERY  1989  ? CATARACT EXTRACTION Bilateral 2010  ? ? ?FAMILY HISTORY: ?Family History  ?Problem Relation Age of Onset  ? Breast cancer Mother   ? Alzheimer's disease Mother   ? Stroke Father   ? ? ?SOCIAL HISTORY: ?Social History  ? ?Socioeconomic History  ? Marital status: Widowed  ?  Spouse name: Not on file  ? Number of children: 3  ? Years of education: 42  ? Highest education level: Not on file  ?Occupational History  ? Occupation: Retired   ?Tobacco Use  ? Smoking status: Never  ? Smokeless tobacco: Not on file  ?Substance and Sexual Activity  ? Alcohol use: No  ? Drug use: No  ? Sexual  activity: Not on file  ?Other Topics Concern  ? Not on file  ?Social History Narrative  ? Lives at home with herself.  ? Caffeine use: 1/2 cup coffee per day, 2 glasses tea per day  ? ?Social Determinants of Health  ? ?Financial Resource Strain: Not on file  ?Food Insecurity: Not on file  ?Transportation Needs: Not on file  ?Physical Activity: Not on file  ?Stress: Not on file  ?Social Connections: Not on file  ?Intimate Partner Violence: Not on file  ? ? ? ? ?Marcial Pacas, M.D. Ph.D. ? ?[facility] ?[address], [address] ?[place], [place] [address] ?Ph: [phone]) [phone] ?Fax: [phone] ? ?CC:  Carol Ada, MD ?[address] ?[address] ?[place],  [place] [address]  Carol Ada, MD   ?

## 2021-07-16 DIAGNOSIS — F0390 Unspecified dementia without behavioral disturbance: Secondary | ICD-10-CM | POA: Insufficient documentation

## 2021-07-17 DIAGNOSIS — J019 Acute sinusitis, unspecified: Secondary | ICD-10-CM | POA: Diagnosis not present

## 2021-07-17 DIAGNOSIS — J029 Acute pharyngitis, unspecified: Secondary | ICD-10-CM | POA: Diagnosis not present

## 2021-07-17 DIAGNOSIS — R059 Cough, unspecified: Secondary | ICD-10-CM | POA: Diagnosis not present

## 2021-08-02 DIAGNOSIS — N1831 Chronic kidney disease, stage 3a: Secondary | ICD-10-CM | POA: Diagnosis not present

## 2021-08-02 DIAGNOSIS — Z0000 Encounter for general adult medical examination without abnormal findings: Secondary | ICD-10-CM | POA: Diagnosis not present

## 2021-08-02 DIAGNOSIS — I679 Cerebrovascular disease, unspecified: Secondary | ICD-10-CM | POA: Diagnosis not present

## 2021-08-02 DIAGNOSIS — E039 Hypothyroidism, unspecified: Secondary | ICD-10-CM | POA: Diagnosis not present

## 2021-08-02 DIAGNOSIS — E782 Mixed hyperlipidemia: Secondary | ICD-10-CM | POA: Diagnosis not present

## 2021-08-02 DIAGNOSIS — I10 Essential (primary) hypertension: Secondary | ICD-10-CM | POA: Diagnosis not present

## 2022-01-18 DIAGNOSIS — L821 Other seborrheic keratosis: Secondary | ICD-10-CM | POA: Diagnosis not present

## 2022-01-18 DIAGNOSIS — L578 Other skin changes due to chronic exposure to nonionizing radiation: Secondary | ICD-10-CM | POA: Diagnosis not present

## 2022-01-18 DIAGNOSIS — L570 Actinic keratosis: Secondary | ICD-10-CM | POA: Diagnosis not present

## 2022-01-18 DIAGNOSIS — C44729 Squamous cell carcinoma of skin of left lower limb, including hip: Secondary | ICD-10-CM | POA: Diagnosis not present

## 2022-02-07 DIAGNOSIS — E039 Hypothyroidism, unspecified: Secondary | ICD-10-CM | POA: Diagnosis not present

## 2022-02-07 DIAGNOSIS — I679 Cerebrovascular disease, unspecified: Secondary | ICD-10-CM | POA: Diagnosis not present

## 2022-02-07 DIAGNOSIS — Z23 Encounter for immunization: Secondary | ICD-10-CM | POA: Diagnosis not present

## 2022-02-07 DIAGNOSIS — E782 Mixed hyperlipidemia: Secondary | ICD-10-CM | POA: Diagnosis not present

## 2022-02-07 DIAGNOSIS — I10 Essential (primary) hypertension: Secondary | ICD-10-CM | POA: Diagnosis not present

## 2022-02-07 DIAGNOSIS — N1831 Chronic kidney disease, stage 3a: Secondary | ICD-10-CM | POA: Diagnosis not present

## 2022-03-01 DIAGNOSIS — C44729 Squamous cell carcinoma of skin of left lower limb, including hip: Secondary | ICD-10-CM | POA: Diagnosis not present

## 2022-04-26 DIAGNOSIS — C44729 Squamous cell carcinoma of skin of left lower limb, including hip: Secondary | ICD-10-CM | POA: Diagnosis not present

## 2022-05-19 DIAGNOSIS — S2001XA Contusion of right breast, initial encounter: Secondary | ICD-10-CM | POA: Diagnosis not present

## 2022-06-17 DIAGNOSIS — L820 Inflamed seborrheic keratosis: Secondary | ICD-10-CM | POA: Diagnosis not present

## 2022-06-17 DIAGNOSIS — C44729 Squamous cell carcinoma of skin of left lower limb, including hip: Secondary | ICD-10-CM | POA: Diagnosis not present

## 2022-06-17 DIAGNOSIS — L570 Actinic keratosis: Secondary | ICD-10-CM | POA: Diagnosis not present

## 2022-08-28 DIAGNOSIS — N1831 Chronic kidney disease, stage 3a: Secondary | ICD-10-CM | POA: Diagnosis not present

## 2022-08-28 DIAGNOSIS — G3184 Mild cognitive impairment, so stated: Secondary | ICD-10-CM | POA: Diagnosis not present

## 2022-08-28 DIAGNOSIS — E538 Deficiency of other specified B group vitamins: Secondary | ICD-10-CM | POA: Diagnosis not present

## 2022-08-28 DIAGNOSIS — D692 Other nonthrombocytopenic purpura: Secondary | ICD-10-CM | POA: Diagnosis not present

## 2022-08-28 DIAGNOSIS — E039 Hypothyroidism, unspecified: Secondary | ICD-10-CM | POA: Diagnosis not present

## 2022-08-28 DIAGNOSIS — E782 Mixed hyperlipidemia: Secondary | ICD-10-CM | POA: Diagnosis not present

## 2022-08-28 DIAGNOSIS — I10 Essential (primary) hypertension: Secondary | ICD-10-CM | POA: Diagnosis not present

## 2022-08-28 DIAGNOSIS — I679 Cerebrovascular disease, unspecified: Secondary | ICD-10-CM | POA: Diagnosis not present

## 2022-08-28 DIAGNOSIS — Z0000 Encounter for general adult medical examination without abnormal findings: Secondary | ICD-10-CM | POA: Diagnosis not present

## 2022-10-23 IMAGING — MR MR MRA NECK W/O CM
5 of 9 series · 30 of 48 positions shown · non-contrast
Comparison: Comparison made with prior MRI from 05/01/2021.

CLINICAL DATA: Follow-up examination for stroke.

EXAM:
MRA NECK WITHOUT CONTRAST
MRA HEAD WITHOUT CONTRAST
TECHNIQUE: Angiographic images of the Circle of Willis were acquired using MRA
technique without intravenous contrast.

[Series 7: tof_fl3d_tra_iso · axial · 0.6mm · 0.52mm/px · z∈[-160,-82]mm · 8 of 133 slices shown (1 of 2)]
[im 1/133]
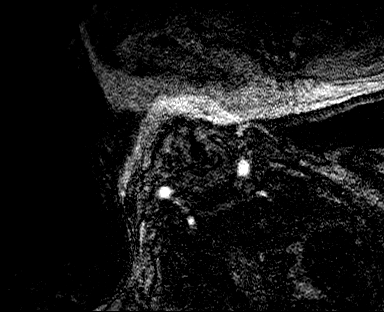
[im 21/133]
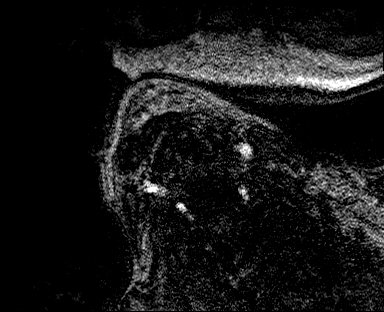
[im 41/133]
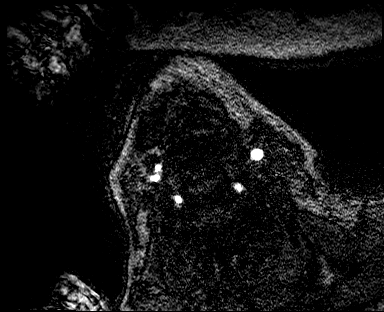
[im 61/133]
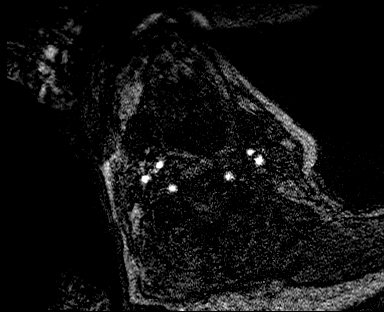
[im 72/133]
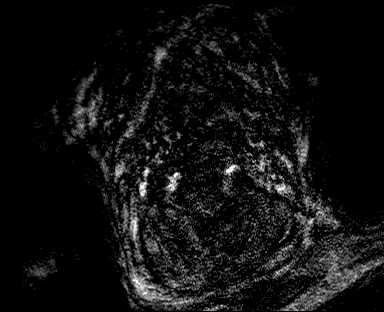
[im 92/133]
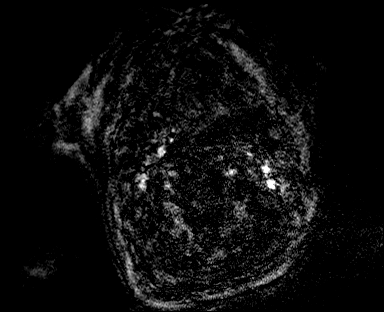
[im 112/133]
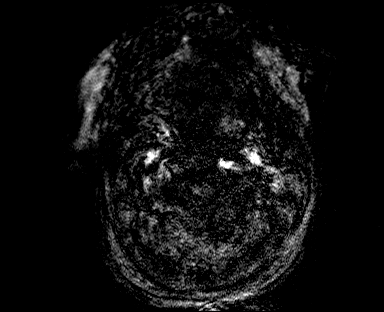
[im 133/133]
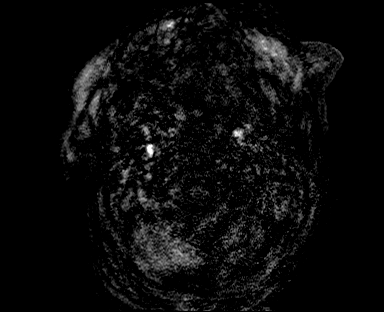

[Series 10: tof_fl2d_tra · axial · 3.5mm · 0.86mm/px · 1 of 4 slices shown (1 of 2)]
[im 1/4]
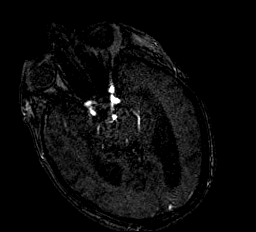

[Series 11: tof_fl3d_tra_iso · axial · 0.6mm · 0.52mm/px · z∈[-160,-82]mm · 9 of 133 slices shown (2 of 2)]
[im 1/133]
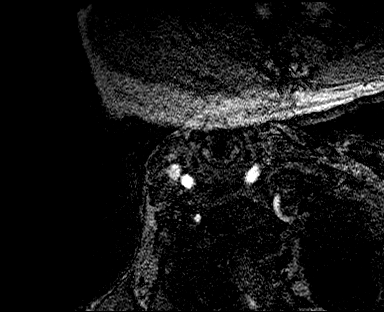
[im 19/133]
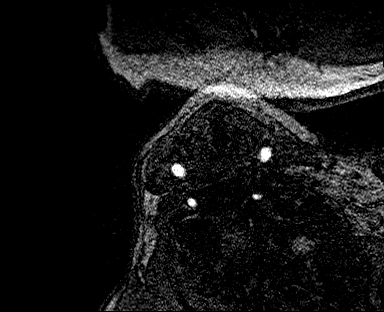
[im 38/133]
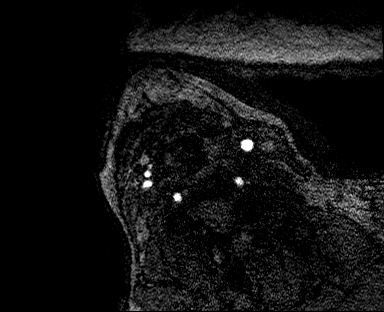
[im 57/133]
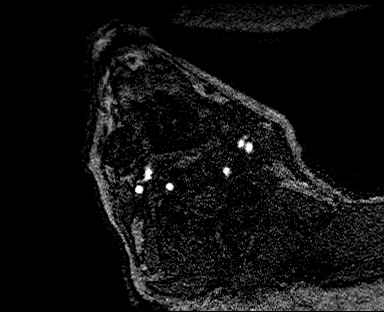
[im 67/133]
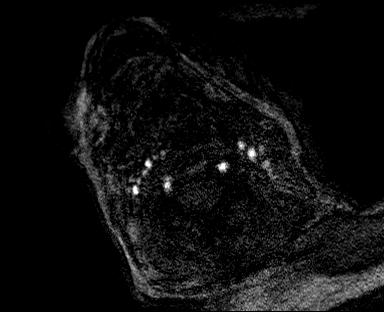
[im 76/133]
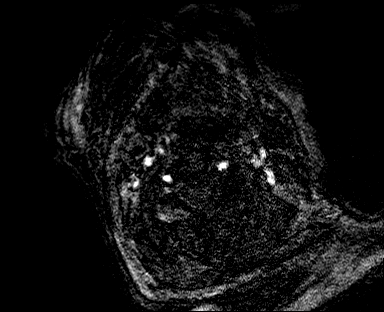
[im 95/133]
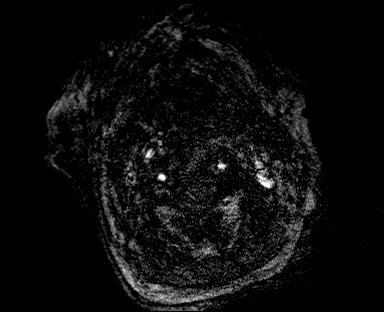
[im 114/133]
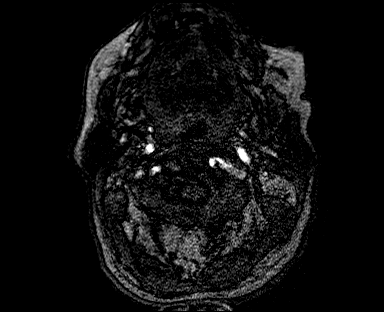
[im 133/133]
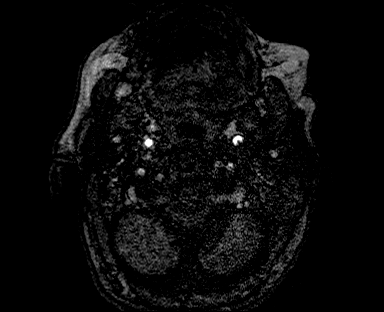

[Series 14: tof_fl2d_tra · axial · 3.5mm · 0.86mm/px · z∈[-238,-21]mm · 11 of 95 slices shown (2 of 2)]
[im 1/95]
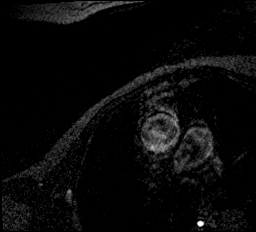
[im 10/95]
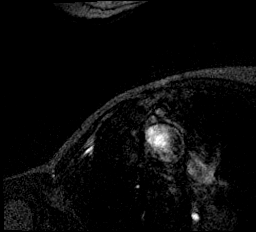
[im 19/95]
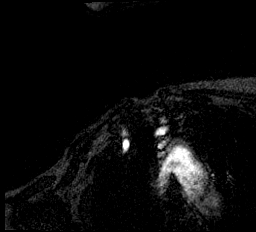
[im 29/95]
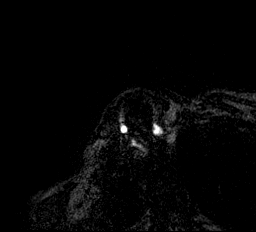
[im 38/95]
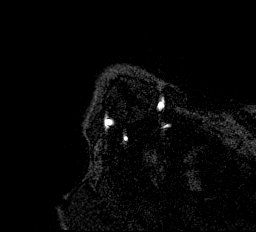
[im 48/95]
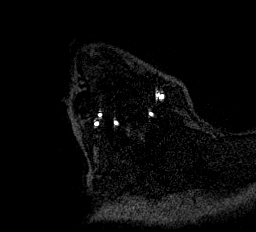
[im 57/95]
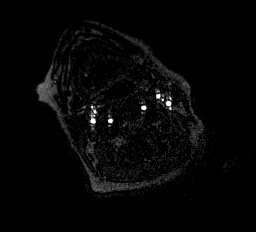
[im 66/95]
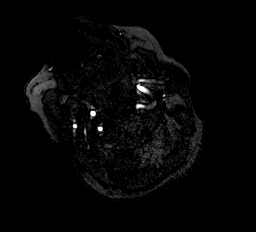
[im 76/95]
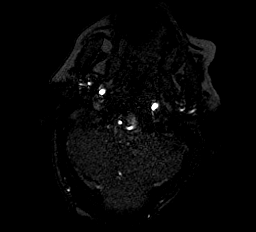
[im 85/95]
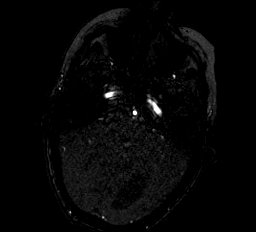
[im 95/95]
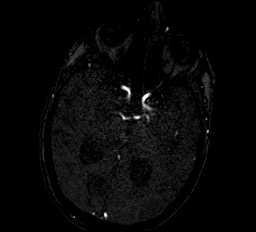

[Series 1003: rt rotate · sagittal · 0.5mm · 0.20mm/px · 1 of 7 slices shown]
[im 1/7]
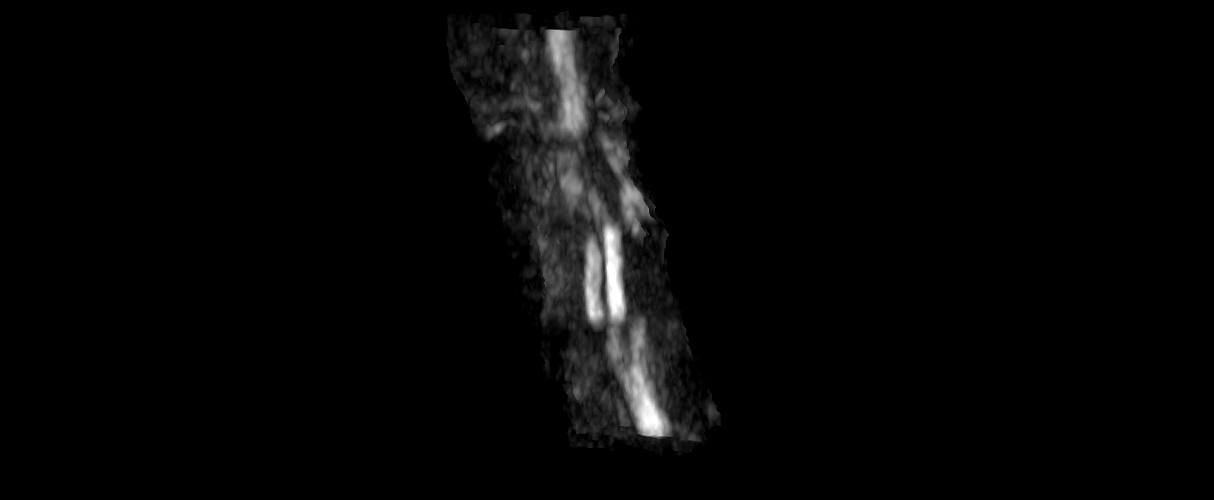

[30 of 48 positions shown; findings below may reference images not displayed]

FINDINGS: MRA NECK FINDINGS

AORTIC ARCH: Examination severely degraded by motion artifact.

Visualized aortic arch grossly within normal limits for caliber with
normal branch pattern. No visible stenosis or other abnormality
about the origin of the great vessels.

RIGHT CAROTID SYSTEM: Visualized right CCA patent without stenosis.
Mild atheromatous irregularity about the right carotid bulb/proximal
right ICA without hemodynamically significant stenosis. Visualized
right ICA otherwise grossly patent without stenosis or evidence for
dissection.

LEFT CAROTID SYSTEM: Visualized left CCA patent without stenosis.
Mild atheromatous irregularity about the left carotid bulb/proximal
left ICA without hemodynamically significant stenosis. Visualized
left ICA otherwise patent without stenosis or evidence for
dissection.

VERTEBRAL ARTERIES: Left vertebral artery likely arises directly
from the aortic arch. Proximal aspects of both vertebral arteries
poorly assessed on this exam due to motion. Visualized portions of
the vertebral arteries grossly patent without visible stenosis or
evidence for dissection.

MRA HEAD FINDINGS

ANTERIOR CIRCULATION:

Examination moderately degraded by motion artifact.

Visualized distal cervical segments of the internal carotid arteries
are patent with antegrade flow. Petrous segments patent bilaterally.
Probable mild atheromatous irregularity within the carotid siphons
without hemodynamically significant stenosis. A1 segments patent
bilaterally. Grossly normal anterior communicating artery complex.
Possible short-segment moderate stenosis involving the proximal left
A2 segment (series 3537, image 10). ACAs otherwise grossly patent to
their distal aspects without significant stenosis.

Right M1 segment patent. Grossly normal right MCA bifurcation.
Probable focal severe proximal right M2 stenosis (series 5, image
93). Distal right MCA branches appear grossly perfused on
time-of-flight sequence, although are attenuated as compared to the
contralateral left MCA branches. Finding in keeping with the chronic
right MCA distribution infarcts seen on prior MRI.

Atheromatous irregularity with mild to moderate diffuse narrowing of
the mid-distal left M1 segment. Normal left MCA bifurcation. Distal
left MCA branches remain patent and perfused.

POSTERIOR CIRCULATION:

Visualized distal V4 segments patent without stenosis. Both PICA
grossly patent at their origins. Basilar patent to its distal aspect
without stenosis. Superior cerebellar arteries patent bilaterally.
Both PCAs primarily supplied via the basilar. Short-segment moderate
to severe proximal left P2 stenosis (series 2865, image 7). PCAs
irregular but otherwise grossly patent to their distal aspects
bilaterally.

No visible intracranial aneurysm.
IMPRESSION: MRA HEAD IMPRESSION:

1. Motion degraded exam.
2. Negative intracranial MRA for large vessel occlusion.
3. Severe proximal right M2 stenosis. Distal right MCA branches are
grossly patent but attenuated as compared to the left. Finding is
consistent with the chronic right MCA territory infarct as seen on
prior MRI.
4. Additional intracranial atherosclerotic disease as above. Notable
findings include a moderate to severe proximal left P2 stenosis,
with possible short-segment moderate left A2 stenosis.

MRA NECK IMPRESSION:

1. Severely motion degraded exam.
2. Mild for age atheromatous change about the carotid bifurcations
without significant stenosis. Both carotid artery systems patent
without hemodynamically significant stenosis.
3. Wide patency of both vertebral arteries within the neck.

## 2023-03-03 DIAGNOSIS — N1831 Chronic kidney disease, stage 3a: Secondary | ICD-10-CM | POA: Diagnosis not present

## 2023-03-03 DIAGNOSIS — D692 Other nonthrombocytopenic purpura: Secondary | ICD-10-CM | POA: Diagnosis not present

## 2023-03-03 DIAGNOSIS — E538 Deficiency of other specified B group vitamins: Secondary | ICD-10-CM | POA: Diagnosis not present

## 2023-03-03 DIAGNOSIS — Z23 Encounter for immunization: Secondary | ICD-10-CM | POA: Diagnosis not present

## 2023-03-03 DIAGNOSIS — E782 Mixed hyperlipidemia: Secondary | ICD-10-CM | POA: Diagnosis not present

## 2023-03-03 DIAGNOSIS — E039 Hypothyroidism, unspecified: Secondary | ICD-10-CM | POA: Diagnosis not present

## 2023-03-03 DIAGNOSIS — I10 Essential (primary) hypertension: Secondary | ICD-10-CM | POA: Diagnosis not present

## 2023-05-15 DIAGNOSIS — I10 Essential (primary) hypertension: Secondary | ICD-10-CM | POA: Diagnosis not present

## 2023-05-15 DIAGNOSIS — N3000 Acute cystitis without hematuria: Secondary | ICD-10-CM | POA: Diagnosis not present

## 2023-05-29 DIAGNOSIS — N3000 Acute cystitis without hematuria: Secondary | ICD-10-CM | POA: Diagnosis not present

## 2023-10-01 DIAGNOSIS — Z0000 Encounter for general adult medical examination without abnormal findings: Secondary | ICD-10-CM | POA: Diagnosis not present

## 2023-10-01 DIAGNOSIS — E782 Mixed hyperlipidemia: Secondary | ICD-10-CM | POA: Diagnosis not present

## 2023-10-01 DIAGNOSIS — I10 Essential (primary) hypertension: Secondary | ICD-10-CM | POA: Diagnosis not present

## 2023-10-01 DIAGNOSIS — Z23 Encounter for immunization: Secondary | ICD-10-CM | POA: Diagnosis not present

## 2023-10-01 DIAGNOSIS — E538 Deficiency of other specified B group vitamins: Secondary | ICD-10-CM | POA: Diagnosis not present

## 2023-10-01 DIAGNOSIS — I2510 Atherosclerotic heart disease of native coronary artery without angina pectoris: Secondary | ICD-10-CM | POA: Diagnosis not present

## 2023-10-01 DIAGNOSIS — I6930 Unspecified sequelae of cerebral infarction: Secondary | ICD-10-CM | POA: Diagnosis not present

## 2023-10-01 DIAGNOSIS — E039 Hypothyroidism, unspecified: Secondary | ICD-10-CM | POA: Diagnosis not present

## 2023-11-18 DIAGNOSIS — N390 Urinary tract infection, site not specified: Secondary | ICD-10-CM | POA: Diagnosis not present

## 2023-11-18 DIAGNOSIS — R410 Disorientation, unspecified: Secondary | ICD-10-CM | POA: Diagnosis not present
